# Patient Record
Sex: Female | Born: 1960 | Race: White | Hispanic: No | Smoking: Former smoker
Health system: Southern US, Community
[De-identification: ages and names within clinical notes are randomized; demographics above are authoritative.]

## PROBLEM LIST (undated history)

## (undated) DIAGNOSIS — J45909 Unspecified asthma, uncomplicated: Secondary | ICD-10-CM

## (undated) DIAGNOSIS — F329 Major depressive disorder, single episode, unspecified: Secondary | ICD-10-CM

## (undated) DIAGNOSIS — F32A Depression, unspecified: Secondary | ICD-10-CM

## (undated) DIAGNOSIS — F0781 Postconcussional syndrome: Secondary | ICD-10-CM

## (undated) DIAGNOSIS — M6289 Other specified disorders of muscle: Secondary | ICD-10-CM

## (undated) DIAGNOSIS — I639 Cerebral infarction, unspecified: Secondary | ICD-10-CM

## (undated) DIAGNOSIS — G8929 Other chronic pain: Secondary | ICD-10-CM

## (undated) DIAGNOSIS — S92309A Fracture of unspecified metatarsal bone(s), unspecified foot, initial encounter for closed fracture: Secondary | ICD-10-CM

## (undated) DIAGNOSIS — M25552 Pain in left hip: Secondary | ICD-10-CM

## (undated) HISTORY — DX: Pain in left hip: M25.552

## (undated) HISTORY — DX: Fracture of unspecified metatarsal bone(s), unspecified foot, initial encounter for closed fracture: S92.309A

## (undated) HISTORY — PX: CLAVICLE SURGERY: SHX598

## (undated) HISTORY — DX: Other chronic pain: G89.29

## (undated) HISTORY — DX: Depression, unspecified: F32.A

## (undated) HISTORY — DX: Major depressive disorder, single episode, unspecified: F32.9

## (undated) HISTORY — DX: Other specified disorders of muscle: M62.89

## (undated) HISTORY — DX: Postconcussional syndrome: F07.81

---

## 2003-06-03 ENCOUNTER — Encounter: Admission: RE | Admit: 2003-06-03 | Discharge: 2003-06-03 | Payer: Self-pay | Admitting: Internal Medicine

## 2008-12-23 ENCOUNTER — Encounter: Admission: RE | Admit: 2008-12-23 | Discharge: 2008-12-23 | Payer: Self-pay | Admitting: Internal Medicine

## 2010-01-12 ENCOUNTER — Ambulatory Visit: Payer: Self-pay | Admitting: Sports Medicine

## 2010-01-12 DIAGNOSIS — S92309A Fracture of unspecified metatarsal bone(s), unspecified foot, initial encounter for closed fracture: Secondary | ICD-10-CM | POA: Insufficient documentation

## 2010-02-02 ENCOUNTER — Ambulatory Visit: Payer: Self-pay | Admitting: Sports Medicine

## 2010-02-23 ENCOUNTER — Ambulatory Visit: Payer: Self-pay | Admitting: Sports Medicine
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
## 2010-07-19 NOTE — Assessment & Plan Note (Signed)
Summary: NP,FOOT INJURY DUE TO RUNNING,MC   Vital Signs:  Patient profile:   50 year old female Height:      67 inches Weight:      128.13 pounds BMI:     20.14 Pulse rate:   80 / minute BP sitting:   113 / 79  (left arm)  Vitals Entered By: Terese Door (January 12, 2010 1:56 PM) CC: NP-left foot injury-runner   CC:  NP-left foot injury-runner.  History of Present Illness: 50 yo F intense runner her for acute L foot pain.  Runs 20 miles per week and bikes 100 miles per week.  Was running 3 weeks ago with no recent change in mileage when at beginning of a run, had sudden onset of significant L foot pain on dorsum of her foot that stopped her in her run because she felt like "it broke."  No running for last 3 weeks. Had done barefoot run just prior to this injury. Had ankle injury 3l months ago. States had similar injury to her R foot last year and was told it was stress fracture, took about 10 weeks to completely heal. Using Newton shoes to run, which are good to protect MT but simultaneously weakens them. Normal menses.  Past History:  Past Medical History: mild asthma  Past Surgical History: denies  Physical Exam  General:  Well-developed,well-nourished,in no acute distress; alert,appropriate and cooperative throughout examination   Foot/Ankle Exam  Foot Exam:    Left:    Swelling:  yes on distal dorsum over 2nd-4th MTs    Inspection/Palpation:  + ttp over 2nd-4th MTs dorsal aspect.  No ttp on plantar MT head regions. Mild pain with extreme plantar/dorsa flexion of 3rd digit.    R > L transverse arch breakdown with wider R foot.   L foot very skinny with heel width of 1.6 cm and forefoot width of 1.8 cm.  Limited US L foot - + complete fx of distal 3rd and 4th MT.  No sign of callus formation.   Impression & Recommendations:  Problem # 1:  CLOSED FRACTURE OF METATARSAL BONE (ICD-825.25) 3rd and 4th distal MT fractures. - placed in cast shoe with heel lift -  given arch strap for compression/support - see pt instructions - f/u 2 weeks to re [place] and assess for healing  Orders: Post-op Shoe ([id number]) Garment,belt,sleeve or other covering ,elastic or similar stretch (E4540) [place] LIMITED ([id number])  Time spent with patient 35 mins  Patient Instructions: 1)  Calcium 2000 mg daily 2)  Vitamin D 800 IU daily 3)  Wear cast shoe except when showering until f/u 4)  Icing twice daily 5)  Ok for cross training as long as no pain. 6)  Please schedule a follow-up appointment in 2 weeks.

## 2010-07-19 NOTE — Assessment & Plan Note (Signed)
Summary: F/U US FOOT,MC   Vital Signs:  Patient profile:   50 year old female BP sitting:   122 / 80  Vitals Entered By: Lillia Pauls CMA (February 02, 2010 11:33 AM)  History of Present Illness: 50 yo F f/u L 3rd and 4th MT fx that occurred just after barefoot running.  Initial injury occurred about 6 weeks ago, seen in clinic 3 weeks ago and placed in post op shoe, not doing any running for last 2-3 weeks.  Not much activity at all. Overall, thinks it is feeling better.  Swelling improved. Not using any anti inflammatories or pain meds. Endorses taking Ca/Vit D.  Physical Exam  General:  Well-developed,well-nourished,in no acute distress; alert,appropriate and cooperative throughout examination   Foot/Ankle Exam  Foot Exam:    Left:    Inspection/Palpation:  Mild TTP over distal 3rd and 4th MT, but improved from last exam. Mild swelling in this area. NVI.    Limited US L foot - Distal 3rd and 4th MT show good callus formation and decreased blood flow.   Impression & Recommendations:  Problem # 1:  CLOSED FRACTURE OF METATARSAL BONE (ICD-825.25) Assessment Improved  Pain improved and healing well based on [place]. - Continue arch strap for compression/splinting - No longer needs post op shoe - Ok for regular running/tennis shoes - Can do light walking on treadmill or stationary bike as long as no pain - Start LE strength exercises as long as pain free in foot (toe raises, heel raises, half squats, half lunges) - continue Ca/Vit D - F/u 3 weeks  Orders: [place] LIMITED ([id number])

## 2010-10-26 DIAGNOSIS — Z01419 Encounter for gynecological examination (general) (routine) without abnormal findings: Secondary | ICD-10-CM | POA: Insufficient documentation

## 2010-11-29 ENCOUNTER — Encounter: Payer: Self-pay | Admitting: Family Medicine

## 2010-11-29 ENCOUNTER — Ambulatory Visit (INDEPENDENT_AMBULATORY_CARE_PROVIDER_SITE_OTHER): Payer: BC Managed Care – PPO | Admitting: Family Medicine

## 2010-11-29 VITALS — BP 119/80 | HR 76 | Ht 67.0 in | Wt 130.0 lb

## 2010-11-29 DIAGNOSIS — M79609 Pain in unspecified limb: Secondary | ICD-10-CM

## 2010-11-29 DIAGNOSIS — M79673 Pain in unspecified foot: Secondary | ICD-10-CM
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
## 2011-02-14 ENCOUNTER — Ambulatory Visit (INDEPENDENT_AMBULATORY_CARE_PROVIDER_SITE_OTHER): Payer: BC Managed Care – PPO | Admitting: Sports Medicine

## 2011-02-14 ENCOUNTER — Encounter: Payer: Self-pay | Admitting: Sports Medicine

## 2011-02-14 VITALS — BP 126/78 | HR 85 | Ht 67.0 in | Wt 133.0 lb

## 2011-02-14 DIAGNOSIS — M6289 Other specified disorders of muscle: Secondary | ICD-10-CM

## 2011-02-14 DIAGNOSIS — F0781 Postconcussional syndrome: Secondary | ICD-10-CM

## 2011-02-14 DIAGNOSIS — R29898 Other symptoms and signs involving the musculoskeletal system: Secondary | ICD-10-CM
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
## 2011-02-14 NOTE — Progress Notes (Deleted)
  Subjective:    Patient ID: Jacqueline Shaw, female    DOB: [birth year], 50 y.o.   MRN: [id number]  HPI    Review of Systems     Objective:   Physical Exam        Assessment & Plan:

## 2011-02-15 NOTE — Assessment & Plan Note (Signed)
We will review notes from other MD workups

## 2011-02-15 NOTE — Assessment & Plan Note (Signed)
Follow up 1 month

## 2011-03-16 ENCOUNTER — Ambulatory Visit: Payer: BC Managed Care – PPO | Admitting: Sports Medicine

## 2011-04-24 ENCOUNTER — Ambulatory Visit: Payer: BC Managed Care – PPO | Admitting: Sports Medicine

## 2012-05-30 ENCOUNTER — Telehealth: Payer: Self-pay | Admitting: *Deleted

## 2012-05-30 ENCOUNTER — Ambulatory Visit (INDEPENDENT_AMBULATORY_CARE_PROVIDER_SITE_OTHER): Payer: BC Managed Care – PPO | Admitting: Sports Medicine

## 2012-05-30 ENCOUNTER — Encounter: Payer: Self-pay | Admitting: Sports Medicine

## 2012-05-30 VITALS — BP 132/81 | HR 89 | Ht 67.0 in | Wt 132.0 lb

## 2012-05-30 DIAGNOSIS — M6289 Other specified disorders of muscle: Secondary | ICD-10-CM

## 2012-05-30 DIAGNOSIS — G8929 Other chronic pain: Secondary | ICD-10-CM | POA: Insufficient documentation

## 2012-05-30 DIAGNOSIS — M76899 Other specified enthesopathies of unspecified lower limb, excluding foot: Secondary | ICD-10-CM

## 2012-05-30 DIAGNOSIS — R29898 Other symptoms and signs involving the musculoskeletal system: Secondary | ICD-10-CM

## 2012-05-30 DIAGNOSIS — M7062 Trochanteric bursitis, left hip: Secondary | ICD-10-CM

## 2012-05-30 LAB — CBC
HCT: 40.3 % (ref 36.0–46.0)
Hemoglobin: 13.4 g/dL (ref 12.0–15.0)
MCH: 31.6 pg (ref 26.0–34.0)
MCHC: 33.3 g/dL (ref 30.0–36.0)
MCV: 95 fL (ref 78.0–100.0)
Platelets: 293 10*3/uL (ref 150–400)
RBC: 4.24 MIL/uL (ref 3.87–5.11)
RDW: 13 % (ref 11.5–15.5)
WBC: 4.4 10*3/uL (ref 4.0–10.5)

## 2012-05-30 LAB — FERRITIN: Ferritin: 23 ng/mL (ref 10–291)
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
## 2012-05-30 NOTE — Telephone Encounter (Signed)
Spoke with pt she states her hip pain is much worse now since having injection.  Painful to bear weight.  Also face is flushed. Per Dr. Darrick Penna advised pt she is probably having a steroid flare.  She should ice hip frequently and rest it for the next 3 days.  Ok to take Aleve or ibuprofen for pain.  Asked her to call back if she has any signs sx of infection.  Pt agreeable.

## 2012-05-30 NOTE — Assessment & Plan Note (Signed)
Not sure if over training nutrition iron deficiency her cardiovascular.  Likely exercise-related iron deficiency. No red flag signs or symptoms currently Plan: Discussed options Plan for CBC, ferritin, vitamin D levels today If iron low may require workup for colon cancer screening.  If all normal would consider cardiovascular as the next avenue of workup

## 2012-05-30 NOTE — Assessment & Plan Note (Signed)
Greater trochanteric bursitis associated with hip abductor weakness. Plan: Hip abductor strengthening.. IT band stretch Corticosteroid injection Followup in 6 weeks

## 2012-05-30 NOTE — Patient Instructions (Addendum)
Thank you for coming in today. Hip abductor weakness Exercises: 30 reps 2 sets Side leg raises Hip rotation Front step ups Side step ups Cross over step ups  Stretches: Prirformis IT band  I will call you with the lab results Come back in 6 weeks.   Call or go to the ER if you develop a large red swollen joint with extreme pain or oozing puss.

## 2012-05-31 ENCOUNTER — Telehealth: Payer: Self-pay | Admitting: Family Medicine

## 2012-05-31 LAB — VITAMIN D 25 HYDROXY (VIT D DEFICIENCY, FRACTURES): Vit D, 25-Hydroxy: 55 ng/mL (ref 30–89)

## 2012-05-31 MED ORDER — HYDROCODONE-ACETAMINOPHEN 5-325 MG PO TABS: 1.0000 | ORAL_TABLET | Freq: Four times a day (QID) | ORAL | Status: DC | PRN

## 2012-05-31 NOTE — Telephone Encounter (Signed)
Patient called back yesterday noting pain following the injection. The pain started a few hours after the injection. She notes that it has worsened since yesterday.  She notes difficulty sleeping because of pain. She denies any fevers or chills, or radiating pain. She lives in [place] and cannot stop by today for a recheck.   A: Injection steroid flair P: Offered a recheck today. Pt is unable to come. Called in Hydrocodone 5/325 1-2 tabs po q6h prn pain #20 Discussed warning signs and symptoms. Will call back today if not better.  F/u with urgent care PRN.

## 2012-06-03 ENCOUNTER — Telehealth: Payer: Self-pay | Admitting: *Deleted

## 2012-06-03 NOTE — Telephone Encounter (Signed)
Per Dr. Darrick Penna left her a VM to call back in regards to her labs.  Her ferritin was 23 and Dr. Darrick Penna suggests that she start on ferrous sulfate 325 per day.

## 2012-06-07 NOTE — Telephone Encounter (Signed)
Spoke with pt- advised her of ferritin level asked her to start ferrous sulfate 325 mg per day per Dr. Darrick Penna.   States steroid flare from hip inj has improved. Still having some of the original hip pain though, but has started strengthening exercises.  She will follow up with Dr. Darrick Penna in 5 weeks.

## 2012-06-07 NOTE — Telephone Encounter (Signed)
Left pt a VM

## 2012-07-09 ENCOUNTER — Ambulatory Visit: Payer: BC Managed Care – PPO | Admitting: Sports Medicine

## 2012-07-16 ENCOUNTER — Ambulatory Visit: Payer: BC Managed Care – PPO | Admitting: Sports Medicine

## 2012-08-06 ENCOUNTER — Ambulatory Visit (INDEPENDENT_AMBULATORY_CARE_PROVIDER_SITE_OTHER): Payer: BC Managed Care – PPO | Admitting: Sports Medicine

## 2012-08-06 ENCOUNTER — Encounter: Payer: Self-pay | Admitting: Sports Medicine

## 2012-08-06 VITALS — BP 124/80 | HR 92 | Ht 67.0 in | Wt 132.0 lb

## 2012-08-06 DIAGNOSIS — M25559 Pain in unspecified hip: Secondary | ICD-10-CM

## 2012-08-06 DIAGNOSIS — G8929 Other chronic pain: Secondary | ICD-10-CM

## 2012-08-06 DIAGNOSIS — M6289 Other specified disorders of muscle: Secondary | ICD-10-CM

## 2012-08-06 DIAGNOSIS — M25552 Pain in left hip: Secondary | ICD-10-CM

## 2012-08-06 DIAGNOSIS — R29898 Other symptoms and signs involving the musculoskeletal system: Secondary | ICD-10-CM

## 2012-08-06 NOTE — Patient Instructions (Signed)
It does not look like your hip problems are from arthritis-- it is more likely a biomechanical issue.  Start the physical therapy.  We gave you the prescription.  Come back to see Dr. Darrick Penna in 4-6 weeks, after you have done most of your PT.  Ask your PCP to send any records (mostly labs) that have been done in the past 1-2 years for Dr. Darrick Penna to review.

## 2012-08-06 NOTE — Assessment & Plan Note (Signed)
Reck with primary physician about any medical issues  I may need to do some additional workup if not clear cause
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
## 2012-08-06 NOTE — Assessment & Plan Note (Signed)
I think she is shifting her pelvic position and this is leading to increased stress over gluteus medius, piriformis and hamstrings on left  Gait is not equal side to side  I would like her to see Sharen Hones for a full assessment and see if he can help her establish more normal biomechanics and work on rehab of hip area

## 2012-09-10 ENCOUNTER — Ambulatory Visit: Payer: BC Managed Care – PPO | Admitting: Sports Medicine

## 2012-10-15 ENCOUNTER — Ambulatory Visit (INDEPENDENT_AMBULATORY_CARE_PROVIDER_SITE_OTHER): Payer: BC Managed Care – PPO | Admitting: Sports Medicine

## 2012-10-15 VITALS — BP 120/70 | Ht 67.0 in | Wt 132.0 lb

## 2012-10-15 DIAGNOSIS — M25559 Pain in unspecified hip: Secondary | ICD-10-CM

## 2012-10-15 DIAGNOSIS — F3289 Other specified depressive episodes: Secondary | ICD-10-CM

## 2012-10-15 DIAGNOSIS — G8929 Other chronic pain: Secondary | ICD-10-CM

## 2012-10-15 DIAGNOSIS — F0781 Postconcussional syndrome: Secondary | ICD-10-CM

## 2012-10-15 DIAGNOSIS — M25552 Pain in left hip: Secondary | ICD-10-CM

## 2012-10-15 DIAGNOSIS — F329 Major depressive disorder, single episode, unspecified: Secondary | ICD-10-CM

## 2012-10-15 MED ORDER — AMITRIPTYLINE HCL 25 MG PO TABS: 25.0000 mg | ORAL_TABLET | Freq: Every day | ORAL | Status: DC

## 2012-10-15 NOTE — Assessment & Plan Note (Signed)
I suggested relative rest for this condition until we can resolve the other more significant problems
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
## 2012-10-15 NOTE — Patient Instructions (Addendum)
Keep up fish oil  Try to add some glutathione to your supplements  Let's try using amitriptyline 25 at night   Stop training and go to easy activity for 30 minutes a day/ if you do swimming cut the volume and effort  Keep up easy hip exercises  Try to deal with personal stresses and start getting some relaxation each day  See me in one month but track your sxs carefully
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
## 2012-10-15 NOTE — Assessment & Plan Note (Signed)
As suggested by her primary care physician I think she has depression. Low-dose amitriptyline may help if it restores the sleep cycle.  However, I would like her to return to DR Riley Nearing for more aggressive treatment of depression if needed.

## 2012-11-05 ENCOUNTER — Ambulatory Visit (INDEPENDENT_AMBULATORY_CARE_PROVIDER_SITE_OTHER): Payer: BC Managed Care – PPO | Admitting: Sports Medicine

## 2012-11-05 VITALS — BP 100/60 | Ht 67.0 in | Wt 132.0 lb

## 2012-11-05 DIAGNOSIS — M25559 Pain in unspecified hip: Secondary | ICD-10-CM

## 2012-11-05 DIAGNOSIS — F0781 Postconcussional syndrome: Secondary | ICD-10-CM

## 2012-11-05 DIAGNOSIS — G8929 Other chronic pain: Secondary | ICD-10-CM

## 2012-11-05 DIAGNOSIS — F329 Major depressive disorder, single episode, unspecified: Secondary | ICD-10-CM

## 2012-11-05 MED ORDER — CYCLOBENZAPRINE HCL 5 MG PO TABS: 5.0000 mg | ORAL_TABLET | Freq: Every day | ORAL | Status: DC
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
## 2012-11-05 NOTE — Assessment & Plan Note (Signed)
Improved sleep pattern may help  Cont to discuss this with Dr Luan Pulling by me 6 wks

## 2012-11-05 NOTE — Assessment & Plan Note (Signed)
This still appears muscular to me  Try deep massage - to see joel tull  Keep working mm strength  Don't worry about training until she deals with personal stressors

## 2012-11-05 NOTE — Assessment & Plan Note (Signed)
She looks better after using amitriptyline However because of side effects we will change to milder dose of flexeril 5 mgm at HS I think she should use this for next 3 months or so  Work on balance

## 2012-11-14 ENCOUNTER — Ambulatory Visit: Payer: BC Managed Care – PPO | Admitting: Sports Medicine

## 2012-12-17 ENCOUNTER — Ambulatory Visit: Payer: BC Managed Care – PPO | Admitting: Sports Medicine

## 2012-12-18 ENCOUNTER — Ambulatory Visit (INDEPENDENT_AMBULATORY_CARE_PROVIDER_SITE_OTHER): Payer: BC Managed Care – PPO | Admitting: Sports Medicine

## 2012-12-18 VITALS — BP 126/81 | Ht 67.0 in | Wt 133.0 lb

## 2012-12-18 DIAGNOSIS — R29898 Other symptoms and signs involving the musculoskeletal system: Secondary | ICD-10-CM

## 2012-12-18 DIAGNOSIS — F329 Major depressive disorder, single episode, unspecified: Secondary | ICD-10-CM

## 2012-12-18 DIAGNOSIS — M6289 Other specified disorders of muscle: Secondary | ICD-10-CM

## 2012-12-18 DIAGNOSIS — F32A Depression, unspecified: Secondary | ICD-10-CM

## 2012-12-18 DIAGNOSIS — F0781 Postconcussional syndrome: Secondary | ICD-10-CM
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
## 2012-12-18 NOTE — Progress Notes (Signed)
Patient ID: Jacqueline Shaw, female   DOB: 10-Oct-1960, 52 y.o.   MRN: [id number]  Still with major stress Sleeps better w flexeril Wakes up 1 to 2x per night  Most of muscle sxs are lower extremity  Has rt AT tightness Feels weak in her hamstrings and hips She gets some bilateral hip pain over the greater trochanters  Pain but she gets fatigued after 50 minutes or so Running causes fatigue very quickly Swimming does not cause much fatigue but she feels like she's been most to work with her upper body  She admits that she is probably depressed primarily over the marital discord. She has not been successful with trying multiple antidepressant medications in the past   Physical exam:  Patient is pleasant but is almost tearful at times  Lt ankle: Normal AT and peroneal tendons No visible erythema or swelling. Range of motion is full in all directions. Strength is 5/5 in all directions. Stable lateral and medial ligaments; squeeze test and kleiger test unremarkable; Talar dome nontender; No pain at base of 5th MT; No tenderness over cuboid; No tenderness over N spot or navicular prominence No tenderness on posterior aspects of lateral and medial malleolus No sign of peroneal tendon subluxations; Negative tarsal tunnel tinel's Able to walk 4 steps.   Good quad strength bilat Lt hip pain over greater troch with hip rotation Rt hip- pain in piriformis with hip rotation Hip flexion good bilat Hip motion good bilat Good abduction strength bilat Good tensor fascilata strength bilat Weak glut maximus on lt, slightly weak on rt ttp over both HS at ischial tub HS weakness bilat Weak reverse leg raise bilat  Bilat shoulder exam: Empty can neg Biceps testing good   Poor balance on lt leg with cone touch Increased sway - standing with eyes closed on rt, unable to balance on lt foot with eyes closed

## 2012-12-18 NOTE — Assessment & Plan Note (Signed)
I am not sure why she has developed this but she has significant hamstring and gluteus maximus weakness today  My suspicion is that this had something to do with change in her gait after returning from concussion  I think the stress and poor rest pattern over the last year have kept her from recovering and developing normal strength in these muscle groups

## 2012-12-18 NOTE — Patient Instructions (Addendum)
Continue massage  Continue flexeril at bedtime  Try to get 30 minutes of aerobic exercise daily- you can do more, but do not push to fatigue  Please do the following exercises daily: Standing running lunge- holding light dumbbells  Hamstring curls and swings wearing ankle weights Standing and lying straight leg raises to the back wearing ankle weights as tolerated Hamstring extender- no weight at first, ok to use ankle weights as you get stronger  Cone touch  One foot balance with eyes closed  Please follow up in 6 weeks  Thank you for seeing [place] today!

## 2012-12-18 NOTE — Assessment & Plan Note (Signed)
I suggested again that if the depression remained significant return to Dr. Riley Nearing and consider trying a different antidepressant. She did OK on Wellbutrin  She and to push her training too hard until she gets through the personal and psychological stressors  Recheck with me in a couple of months

## 2012-12-31 ENCOUNTER — Other Ambulatory Visit: Payer: Self-pay | Admitting: *Deleted

## 2012-12-31 MED ORDER — CYCLOBENZAPRINE HCL 5 MG PO TABS: 5.0000 mg | ORAL_TABLET | Freq: Every day | ORAL | Status: DC

## 2013-01-28 ENCOUNTER — Ambulatory Visit: Payer: BC Managed Care – PPO | Admitting: Sports Medicine

## 2013-02-26 ENCOUNTER — Encounter: Payer: Self-pay | Admitting: Sports Medicine

## 2013-02-26 ENCOUNTER — Ambulatory Visit (INDEPENDENT_AMBULATORY_CARE_PROVIDER_SITE_OTHER): Payer: BC Managed Care – PPO | Admitting: Sports Medicine

## 2013-02-26 VITALS — BP 125/79 | HR 67 | Ht 67.0 in | Wt 133.0 lb

## 2013-02-26 DIAGNOSIS — R29898 Other symptoms and signs involving the musculoskeletal system: Secondary | ICD-10-CM

## 2013-02-26 DIAGNOSIS — M6289 Other specified disorders of muscle: Secondary | ICD-10-CM

## 2013-02-26 DIAGNOSIS — Z72820 Sleep deprivation: Secondary | ICD-10-CM

## 2013-02-26 DIAGNOSIS — F0781 Postconcussional syndrome: Secondary | ICD-10-CM

## 2013-02-26 NOTE — Patient Instructions (Signed)
Thank you for coming in today  Stop flexeril Restart amitriptyline 1/2 tablet at night No liquids 3 hrs before bedtime Continue counseling. Please call if you need another name to try. We will refer you to vestibular rehab/neuro rehab  Followup in 1 month
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
## 2013-09-27 ENCOUNTER — Other Ambulatory Visit: Payer: Self-pay

## 2013-09-27 ENCOUNTER — Encounter (HOSPITAL_COMMUNITY): Payer: Self-pay | Admitting: Emergency Medicine

## 2013-09-27 ENCOUNTER — Emergency Department (HOSPITAL_COMMUNITY): Payer: BC Managed Care – PPO

## 2013-09-27 ENCOUNTER — Emergency Department (HOSPITAL_COMMUNITY)
Admission: EM | Admit: 2013-09-27 | Discharge: 2013-09-27 | Disposition: A | Discharge status: Home / Self Care | Payer: BC Managed Care – PPO | Attending: Emergency Medicine | Admitting: Emergency Medicine

## 2013-09-27 DIAGNOSIS — J45909 Unspecified asthma, uncomplicated: Secondary | ICD-10-CM | POA: Insufficient documentation

## 2013-09-27 DIAGNOSIS — R42 Dizziness and giddiness: Secondary | ICD-10-CM | POA: Insufficient documentation

## 2013-09-27 DIAGNOSIS — Z8673 Personal history of transient ischemic attack (TIA), and cerebral infarction without residual deficits: Secondary | ICD-10-CM | POA: Insufficient documentation

## 2013-09-27 DIAGNOSIS — Z87891 Personal history of nicotine dependence: Secondary | ICD-10-CM | POA: Insufficient documentation

## 2013-09-27 DIAGNOSIS — Z79899 Other long term (current) drug therapy: Secondary | ICD-10-CM | POA: Insufficient documentation

## 2013-09-27 DIAGNOSIS — R002 Palpitations: Secondary | ICD-10-CM | POA: Insufficient documentation

## 2013-09-27 DIAGNOSIS — Z7982 Long term (current) use of aspirin: Secondary | ICD-10-CM | POA: Insufficient documentation

## 2013-09-27 HISTORY — DX: Unspecified asthma, uncomplicated: J45.909

## 2013-09-27 HISTORY — DX: Cerebral infarction, unspecified: I63.9

## 2013-09-27 LAB — BASIC METABOLIC PANEL
BUN: 15 mg/dL (ref 6–23)
CO2: 24 mEq/L (ref 19–32)
Calcium: 9.9 mg/dL (ref 8.4–10.5)
Chloride: 103 mEq/L (ref 96–112)
Creatinine, Ser: 0.78 mg/dL (ref 0.50–1.10)
GFR calc Af Amer: >90 mL/min (ref >90)
GFR calc non Af Amer: >90 mL/min (ref >90)
Glucose, Bld: 99 mg/dL (ref 70–99)
Potassium: 4.2 mEq/L (ref 3.7–5.3)
Sodium: 141 mEq/L (ref 137–147)

## 2013-09-27 LAB — CBC
HCT: 39.5 % (ref 36.0–46.0)
Hemoglobin: 13.9 g/dL (ref 12.0–15.0)
MCH: 33.2 pg (ref 26.0–34.0)
MCHC: 35.2 g/dL (ref 30.0–36.0)
MCV: 94.3 fL (ref 78.0–100.0)
Platelets: 262 10*3/uL (ref 150–400)
RBC: 4.19 MIL/uL (ref 3.87–5.11)
RDW: 12.8 % (ref 11.5–15.5)
WBC: 8.7 10*3/uL (ref 4.0–10.5)

## 2013-09-27 LAB — I-STAT TROPONIN, ED: Troponin i, poc: 0 ng/mL (ref 0.00–0.08)

## 2013-09-27 NOTE — ED Notes (Signed)
Unable to get into the room with the pt edp at bedside now

## 2013-09-27 NOTE — ED Provider Notes (Signed)
CSN: [id number]     Arrival date & time 09/27/13  1832 History   First MD Initiated Contact with Patient 09/27/13 2033     Chief Complaint  Patient presents with  . Tachycardia      HPI Pt reports while running that her heart rate went to 210. Pt reports heart rate would decrease when she would come to a complete stop. Pt reports similar incident happened back in March. Pt reports normally during her run her heart rate would go no higher than 170. Pt reports feeling dizziness for some time, around 2012. Pt reports intermittent chest pain during her runs. Pt also reports fatigue prior to room.  Past Medical History  Diagnosis Date  . Stroke   . Asthma    History reviewed. No pertinent past surgical history. No family history on file. History  Substance Use Topics  . Smoking status: Former Games developer  . Smokeless tobacco: Never Used  . Alcohol Use: Yes     Comment: ocassional   OB History   Grav Para Term Preterm Abortions TAB SAB Ect Mult Living                 Review of Systems    Allergies  Bee venom and Shellfish allergy  Home Medications   Current Outpatient Rx  Name  Route  Sig  Dispense  Refill  . albuterol (PROVENTIL HFA;VENTOLIN HFA) 108 (90 BASE) MCG/ACT inhaler   Inhalation   Inhale 1 puff into the lungs every 6 (six) hours as needed for wheezing or shortness of breath.         . amphetamine-dextroamphetamine (ADDERALL) 10 MG tablet   Oral   Take 10 mg by mouth daily with breakfast.         . aspirin EC 81 MG tablet   Oral   Take 81 mg by mouth daily.         . Cholecalciferol (VITAMIN D3) 2400 UNIT/ML LIQD   Oral   Take 1 mL by mouth once a week.          . co-enzyme Q-10 30 MG capsule   Oral   Take 30 mg by mouth daily.           . Flaxseed, Linseed, (FLAX SEED OIL PO)   Oral   Take 3-4 capsules by mouth daily.         . Multiple Vitamin (MULTIVITAMIN) tablet   Oral   Take 1 tablet by mouth daily.           . Omega-3 Fatty  Acids (FISH OIL PO)   Oral   Take 3-4 capsules by mouth daily.          BP 128/65  Pulse 76  Temp(Src) 98 F (36.7 C) (Oral)  Resp 16  Ht 5\' 7"  (1.702 m)  Wt 141 lb (63.957 kg)  BMI 22.08 kg/m2  SpO2 100%  LMP 08/30/2013 Physical Exam  Nursing note and vitals reviewed. Constitutional: She is oriented to person, place, and time. She appears well-developed and well-nourished. No distress.  HENT:  Head: Normocephalic and atraumatic.  Eyes: Pupils are equal, round, and reactive to light.  Neck: Normal range of motion.  Cardiovascular: Normal rate and intact distal pulses.  Exam reveals no friction rub.   No murmur heard. Pulmonary/Chest: No respiratory distress.  Abdominal: Normal appearance. She exhibits no distension.  Musculoskeletal: Normal range of motion.  Neurological: She is alert and oriented to person, place, and time. No cranial  nerve deficit.  Skin: Skin is warm and dry. No rash noted.  Psychiatric: She has a normal mood and affect. Her behavior is normal.    ED Course  Procedures (including critical care time) Labs Review Labs Reviewed  CBC  BASIC METABOLIC PANEL  I-STAT TROPOININ, ED   Imaging Review Dg Chest 2 View (if Patient Has Fever And/or Copd)  09/27/2013   CLINICAL DATA:  Tachycardia  EXAM: CHEST  2 VIEW  COMPARISON:  None.  FINDINGS: The heart size and mediastinal contours are within normal limits. Both lungs are clear. The visualized skeletal structures are unremarkable.  IMPRESSION: No active cardiopulmonary disease.   Electronically Signed   By: Signa Kellaylor  Stroud M.D.   On: 09/27/2013 19:50      Date: 09/27/2013  Rate: 58  Rhythm: normal sinus rhythm  QRS Axis: normal  Intervals: normal  ST/T Wave abnormalities: normal  Conduction Disutrbances:none:   Old EKG Reviewed: none available      MDM   Final diagnoses:  Palpitations    58    Nelia Shiobert L Davonta Stroot, MD 09/28/13 2329

## 2013-09-27 NOTE — ED Notes (Addendum)
Pt reports while running that her heart rate went to 210. Pt reports heart rate would decrease when she would come to a complete stop. Pt reports similar incident happened back in March. Pt reports normally during her run her heart rate would go no higher than 170. Pt reports feeling dizziness for some time, around 2012. Pt reports intermittent chest pain during her runs. Pt also reports fatigue prior to room.

## 2013-10-02 ENCOUNTER — Telehealth: Payer: Self-pay | Admitting: Cardiovascular Disease

## 2013-10-02 NOTE — Telephone Encounter (Signed)
Informed patient that Dr. Excell Seltzer and his primary nurse are not in office today.  If Dr. Excell Seltzer can see her as a new patient, his nurse would be the one to schedule this.  Otherwise his schedule is full until July. Informed her someone will call her back tomorrow or early next week to discuss a possible new patient appointment.  Patient verbalizes understanding.

## 2013-10-02 NOTE — Telephone Encounter (Signed)
lmtcb

## 2013-10-02 NOTE — Telephone Encounter (Signed)
New Message:  Pt is requesting a call back from the nurse to be worked in as a new pt... Pt was recently seen in the hospital Dx Palpitations. Discharge instructions states Call Tonny BollmanMichael Cooper, MD.  Pt's PcP Dr Riley NearingAguiar in [place]. Pt did not have number. ... Pt wants a call back with a new pt appt with Dr. Excell Seltzerooper. He has no more available slots

## 2013-10-03 NOTE — Telephone Encounter (Signed)
Please schedule this pt to see Cardiologist with first available new patient slot.

## 2013-10-07 NOTE — Telephone Encounter (Signed)
Appointment has been scheduled on 10/09/13 with Dr Eden Emms.

## 2013-10-08 ENCOUNTER — Encounter: Payer: Self-pay | Admitting: *Deleted

## 2013-10-08 ENCOUNTER — Encounter: Payer: Self-pay | Admitting: Cardiovascular Disease

## 2013-10-09 ENCOUNTER — Ambulatory Visit (INDEPENDENT_AMBULATORY_CARE_PROVIDER_SITE_OTHER): Payer: BC Managed Care – PPO | Admitting: Cardiovascular Disease

## 2013-10-09 ENCOUNTER — Encounter: Payer: Self-pay | Admitting: Cardiovascular Disease

## 2013-10-09 VITALS — BP 140/90 | HR 86 | Ht 67.0 in | Wt 144.0 lb

## 2013-10-09 DIAGNOSIS — F3289 Other specified depressive episodes: Secondary | ICD-10-CM

## 2013-10-09 DIAGNOSIS — F329 Major depressive disorder, single episode, unspecified: Secondary | ICD-10-CM

## 2013-10-09 DIAGNOSIS — R0600 Dyspnea, unspecified: Secondary | ICD-10-CM | POA: Insufficient documentation

## 2013-10-09 DIAGNOSIS — R0989 Other specified symptoms and signs involving the circulatory and respiratory systems: Secondary | ICD-10-CM

## 2013-10-09 DIAGNOSIS — R002 Palpitations: Secondary | ICD-10-CM

## 2013-10-09 DIAGNOSIS — R0609 Other forms of dyspnea: Secondary | ICD-10-CM

## 2013-10-09 DIAGNOSIS — F32A Depression, unspecified: Secondary | ICD-10-CM

## 2013-10-09 DIAGNOSIS — R000 Tachycardia, unspecified: Secondary | ICD-10-CM

## 2013-10-09 LAB — HEMOGLOBIN AND HEMATOCRIT, BLOOD
HEMATOCRIT: 40.3 % (ref 36.0–46.0)
Hemoglobin: 13.6 g/dL (ref 12.0–15.0)

## 2013-10-09 LAB — TSH: TSH: 1.75 u[IU]/mL (ref 0.35–5.50)

## 2013-10-09 LAB — RHEUMATOID FACTOR

## 2013-10-09 LAB — SEDIMENTATION RATE: Sed Rate: 9 mm/hr (ref 0–22)
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
## 2013-10-09 NOTE — Assessment & Plan Note (Signed)
May be playing a role here.  Hormone replacement also seems like an attempt to "feel"  Better Not clear why she needs testosterone other than performance

## 2013-10-09 NOTE — Assessment & Plan Note (Signed)
Normal exam and CXR  Order cardiopulmonary stress test to see if exercise limited by CO or pulmonary funcition  Will also help with objective HR response to exercise

## 2013-10-09 NOTE — Assessment & Plan Note (Signed)
Not clear that she is having SVT.  24 hour holter to see what average HR is and document normal circadian fall at night.  Echo to r/o structural heart disease.  Labs to include TSH today

## 2013-10-09 NOTE — Patient Instructions (Signed)
You will need lab work today: Hemoglobin & Hematocrit, T4 & TSH, ESR, Rheumatoid factor We will call you with your results  Your physician has recommended that you have a cardiopulmonary stress test (CPX). CPX testing is a non-invasive measurement of heart and lung function. It replaces a traditional treadmill stress test. This type of test provides a tremendous amount of information that relates not only to your present condition but also for future outcomes. This test combines measurements of you ventilation, respiratory gas exchange in the lungs, electrocardiogram (EKG), blood pressure and physical response before, during, and following an exercise protocol.  Your physician has recommended that you wear a 24 hour holter monitor. Holter monitors are medical devices that record the heart's electrical activity. Doctors most often use these monitors to diagnose arrhythmias. Arrhythmias are problems with the speed or rhythm of the heartbeat. The monitor is a small, portable device. You can wear one while you do your normal daily activities. This is usually used to diagnose what is causing palpitations/syncope (passing out).  Your physician has requested that you have an echocardiogram. Echocardiography is a painless test that uses sound waves to create images of your heart. It provides your doctor with information about the size and shape of your heart and how well your heart's chambers and valves are working. This procedure takes approximately one hour. There are no restrictions for this procedure.   

## 2013-10-10 LAB — T4: T4 TOTAL: 7.7 ug/dL (ref 5.0–12.5)

## 2013-10-13 ENCOUNTER — Ambulatory Visit (HOSPITAL_COMMUNITY): Payer: BC Managed Care – PPO | Attending: Cardiovascular Disease

## 2013-10-13 ENCOUNTER — Encounter: Payer: Self-pay | Admitting: *Deleted

## 2013-10-13 ENCOUNTER — Encounter (INDEPENDENT_AMBULATORY_CARE_PROVIDER_SITE_OTHER): Payer: BC Managed Care – PPO

## 2013-10-13 ENCOUNTER — Telehealth: Payer: Self-pay | Admitting: Cardiology

## 2013-10-13 DIAGNOSIS — R002 Palpitations: Secondary | ICD-10-CM

## 2013-10-13 DIAGNOSIS — R000 Tachycardia, unspecified: Secondary | ICD-10-CM

## 2013-10-13 NOTE — Telephone Encounter (Signed)
New message  ° ° °Patient calling back to speak with nurse  °

## 2013-10-13 NOTE — Telephone Encounter (Signed)
PT AWARE OF LAB RESULTS./CY 

## 2013-10-13 NOTE — Progress Notes (Unsigned)
Patient ID: Jacqueline Shaw, female   DOB: 29-Sep-1960, 53 y.o.   MRN: [id number] E-cardio 24 hour holter monitor applied to patient.

## 2013-10-14 DIAGNOSIS — R0989 Other specified symptoms and signs involving the circulatory and respiratory systems: Secondary | ICD-10-CM

## 2013-10-14 DIAGNOSIS — R0609 Other forms of dyspnea: Secondary | ICD-10-CM

## 2013-10-17 ENCOUNTER — Ambulatory Visit (HOSPITAL_COMMUNITY): Payer: BC Managed Care – PPO | Attending: Internal Medicine | Admitting: Radiology

## 2013-10-17 DIAGNOSIS — R000 Tachycardia, unspecified: Secondary | ICD-10-CM | POA: Insufficient documentation

## 2013-10-17 DIAGNOSIS — R0600 Dyspnea, unspecified: Secondary | ICD-10-CM

## 2013-10-17 DIAGNOSIS — R0602 Shortness of breath: Secondary | ICD-10-CM

## 2013-10-17 DIAGNOSIS — R002 Palpitations: Secondary | ICD-10-CM

## 2013-10-17 DIAGNOSIS — I519 Heart disease, unspecified: Secondary | ICD-10-CM | POA: Insufficient documentation

## 2013-10-17 NOTE — Progress Notes (Signed)
Echocardiogram performed.  

## 2013-10-27 ENCOUNTER — Telehealth: Payer: Self-pay | Admitting: *Deleted

## 2013-10-27 NOTE — Telephone Encounter (Signed)
LEFT MESSAGE TO CALL BACK RE  MONITOR PER  DR NISHAN  SR  PAC NO SIG ARRHYTHMIAS./CY

## 2013-10-27 NOTE — Telephone Encounter (Signed)
PT AWARE OF HOLTER RESULTS./CY 

## 2013-10-28 ENCOUNTER — Encounter: Payer: Self-pay | Admitting: Sports Medicine

## 2013-10-28 ENCOUNTER — Ambulatory Visit (INDEPENDENT_AMBULATORY_CARE_PROVIDER_SITE_OTHER): Payer: BC Managed Care – PPO | Admitting: Sports Medicine

## 2013-10-28 VITALS — BP 118/80 | Ht 67.0 in | Wt 142.0 lb

## 2013-10-28 DIAGNOSIS — N393 Stress incontinence (female) (male): Secondary | ICD-10-CM

## 2013-10-28 DIAGNOSIS — G8929 Other chronic pain: Secondary | ICD-10-CM

## 2013-10-28 DIAGNOSIS — M25552 Pain in left hip: Principal | ICD-10-CM

## 2013-10-28 DIAGNOSIS — R269 Unspecified abnormalities of gait and mobility: Secondary | ICD-10-CM | POA: Insufficient documentation

## 2013-10-28 DIAGNOSIS — M25559 Pain in unspecified hip: Secondary | ICD-10-CM

## 2013-10-28 NOTE — Patient Instructions (Signed)
Easy hamstring stretches - 3 stretches 20 sec each  Hamstring exercises - M/ w/ F Extender 5 sets of 8 if easy move to weights Diver 4 sets of 8 Reverse lunge on step - 3 x 15  HS exercises - Tu/ thurs/ Sat Hops - 3 sets of 10 hops each leg overstrides - 50 yards x 5 Basic curls and swings 3 sets of 15 curls and swings  Hip exercises Lateral lift  Standing rotations  Butt squeeze is 5 for 5 breaths \\Bridge  with butt contraction Straight leg raise with abdominal muscle firing ont that side
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
## 2013-10-28 NOTE — Assessment & Plan Note (Signed)
Dynamic gait drills prescribed  See the effect on fatigue

## 2013-10-28 NOTE — Assessment & Plan Note (Signed)
This still seems a biomechanical issue Very poor strength in medial HS and left hip abductors  See HEP  Reck 6 weeks

## 2013-10-28 NOTE — Assessment & Plan Note (Signed)
Given a series of pelvic stabilization exercises

## 2013-11-27 ENCOUNTER — Encounter: Payer: Self-pay | Admitting: Sports Medicine

## 2013-11-27 ENCOUNTER — Ambulatory Visit (INDEPENDENT_AMBULATORY_CARE_PROVIDER_SITE_OTHER): Payer: BC Managed Care – PPO | Admitting: Sports Medicine

## 2013-11-27 VITALS — BP 140/81 | Ht 67.0 in | Wt 140.0 lb

## 2013-11-27 DIAGNOSIS — S93609A Unspecified sprain of unspecified foot, initial encounter: Secondary | ICD-10-CM

## 2013-11-27 DIAGNOSIS — M25552 Pain in left hip: Principal | ICD-10-CM

## 2013-11-27 DIAGNOSIS — M25559 Pain in unspecified hip: Secondary | ICD-10-CM

## 2013-11-27 DIAGNOSIS — G8929 Other chronic pain: Secondary | ICD-10-CM

## 2013-11-27 DIAGNOSIS — S93692A Other sprain of left foot, initial encounter: Secondary | ICD-10-CM

## 2013-11-27 DIAGNOSIS — N393 Stress incontinence (female) (male): Secondary | ICD-10-CM

## 2013-11-27 DIAGNOSIS — M722 Plantar fascial fibromatosis: Secondary | ICD-10-CM

## 2013-11-27 NOTE — Assessment & Plan Note (Signed)
She was given exercises Arch strap Icing Heel cup  She will probably need to cross train for about 6 weeks until I can reevaluate this

## 2013-11-27 NOTE — Assessment & Plan Note (Signed)
Improved with exercises and she should continue these

## 2013-11-27 NOTE — Progress Notes (Signed)
History was provided by the patient.  Jacqueline Shaw is a 53 y.o. female who is here for chronic hip pain follow up.     HPI:    Hip Pain Ms. Jacqueline Shaw reports that she has been doing well since her last visit. She has been doing the strengthening exercises that were prescribed daily for four weeks and says that she has noticed an improvement. The exercises concentrate on strengthening her core, hamstrings and gluteus and she feels stronger in these areas. She also reports that she has started to notice that her hip pain is improving.   She still has difficulty with athletic activity compared to her prior baseline. She reports that whenever she does the exercises, runs, bikes or does other motor activity like get out of a car, it is like she is "doing it for the first time" and she has to "think about the movements." She says she can feel her body getting stronger but she is still having some trouble with athletic activity. She has had two good runs in the past month in which she felt like her normal self for the first time.   Foot Pain She reports that on Sunday she went on a run and felt a sharp pain in her left foot which felt similar to plantar fasciitis which she had 6 years ago. The following day, the pain was so severe she could not walk, but it has since improved. The pain is located in her arch and inner heel primarily, but extends to include the entire heel. It is worse after sitting and immobility and improves with movement.    Stress incontinence Reports that her incontinence is improving with exercises she is doing to strengthen pelvic floor muscles   Physical Exam:  BP 140/81  Ht 5\' 7"  (1.702 m)  Wt 140 lb (63.504 kg)  BMI 21.92 kg/m2    General:   alert, cooperative and no distress. Very fit athletic woman  Skin:   tanned. otherwise normal  MSK:  Hips: good range of motion with both internal and external rotation of bilateral hips. Strength is improved.   heel: pain with  palpation of left heel plantar surface, especially at insertion of plantar fascia to calcaneous. Able to walk without pain. On running, with significant foot pain requiring stopping. Subsequently with limp.  Neuro:  normal without focal findings and mental status, speech normal, alert and oriented x3   Ultrasound:  small tear at the insertion of the plantar fascia to the calcaneous. No significant increase in fascia thickness. Some associated swelling.   Assessment/Plan:  1. Chronic left hip pain Improving with strengthening exercises. Will add some additional exercises to continue to increase balance and strength  - continue exercises, adding increased weight to 5 pounds one day per week,  - Do balance tests with eyes closed - start calf raises- 3 sets of 15  2. Stress incontinence Improving - continue pelvic floor strengthening exercises  3. Traumatic rupture of plantar fascia of left foot ultrasound evaluation today reveals small tear at the insertion of the plantar fascia to the calcaneous. No significant increase in fascia thickness. Some associated swelling. Patient with significant pain on running. Will rest and ice.  - No hopping exercises with hurt foot - Over the next 6 weeks, avoid running, focus on biking  - Ice the affected heel    - Follow-up visit in 6 weeks for recheck, or sooner as needed.   Jacqueline Pingley [name], MD [facility] Resident, PGY1 11/27/2013  Edited and reviewed Sterling Big, MD

## 2013-11-27 NOTE — Assessment & Plan Note (Signed)
Continue to work on hip strip and better function of her hamstrings and gluteus maximus

## 2013-11-27 NOTE — Patient Instructions (Signed)
Easy hamstring stretches - 3 stretches 20 sec each   Hamstring exercises - M/ w/ F  Extender 5 sets of 8 if easy move to weights  Diver 4 sets of 8  Reverse lunge on step - 3 x 15   HS exercises - Tu/ thurs/ Sat  Hops - 3 sets of 10 hops each leg  overstrides - 50 yards x 5  Basic curls and swings 3 sets of 15 curls and swings   Hip exercises  Lateral lift  Standing rotations   Butt squeeze is 5 for 5 breaths  Bridge with butt contraction  Straight leg raise with abdominal muscle firing on that side    New additions:  Do balance tests with eyes closed No hopping with hurt foot Calf raises on step- 3 sets of 15 One day per week, increase weight to 5 pounds Over the next 6 weeks, focus on biking  Ice the affected heel a lot

## 2013-12-08 DIAGNOSIS — R059 Cough, unspecified: Secondary | ICD-10-CM | POA: Insufficient documentation

## 2013-12-08 DIAGNOSIS — J45990 Exercise induced bronchospasm: Secondary | ICD-10-CM | POA: Insufficient documentation

## 2013-12-08 DIAGNOSIS — F4322 Adjustment disorder with anxiety: Secondary | ICD-10-CM | POA: Insufficient documentation

## 2013-12-08 DIAGNOSIS — R55 Syncope and collapse: Secondary | ICD-10-CM | POA: Insufficient documentation

## 2013-12-08 DIAGNOSIS — N951 Menopausal and female climacteric states: Secondary | ICD-10-CM | POA: Insufficient documentation

## 2013-12-08 DIAGNOSIS — J301 Allergic rhinitis due to pollen: Secondary | ICD-10-CM | POA: Insufficient documentation

## 2013-12-08 DIAGNOSIS — G4700 Insomnia, unspecified: Secondary | ICD-10-CM | POA: Insufficient documentation

## 2013-12-08 DIAGNOSIS — F4310 Post-traumatic stress disorder, unspecified: Secondary | ICD-10-CM | POA: Insufficient documentation

## 2013-12-08 DIAGNOSIS — F900 Attention-deficit hyperactivity disorder, predominantly inattentive type: Secondary | ICD-10-CM | POA: Insufficient documentation

## 2013-12-08 DIAGNOSIS — I658 Occlusion and stenosis of other precerebral arteries: Secondary | ICD-10-CM | POA: Insufficient documentation

## 2013-12-08 DIAGNOSIS — R5383 Other fatigue: Secondary | ICD-10-CM | POA: Insufficient documentation

## 2013-12-08 DIAGNOSIS — Z8673 Personal history of transient ischemic attack (TIA), and cerebral infarction without residual deficits: Secondary | ICD-10-CM | POA: Insufficient documentation

## 2013-12-08 DIAGNOSIS — R202 Paresthesia of skin: Secondary | ICD-10-CM | POA: Insufficient documentation

## 2013-12-08 DIAGNOSIS — H539 Unspecified visual disturbance: Secondary | ICD-10-CM | POA: Insufficient documentation

## 2014-01-06 ENCOUNTER — Encounter: Payer: Self-pay | Admitting: Sports Medicine

## 2014-01-06 ENCOUNTER — Ambulatory Visit (INDEPENDENT_AMBULATORY_CARE_PROVIDER_SITE_OTHER): Payer: BC Managed Care – PPO | Admitting: Sports Medicine

## 2014-01-06 VITALS — BP 115/77 | HR 98 | Ht 67.0 in | Wt 140.0 lb

## 2014-01-06 DIAGNOSIS — G8929 Other chronic pain: Secondary | ICD-10-CM

## 2014-01-06 DIAGNOSIS — M722 Plantar fascial fibromatosis: Secondary | ICD-10-CM

## 2014-01-06 DIAGNOSIS — M25559 Pain in unspecified hip: Secondary | ICD-10-CM

## 2014-01-06 DIAGNOSIS — M25552 Pain in left hip: Principal | ICD-10-CM

## 2014-01-06 DIAGNOSIS — N393 Stress incontinence (female) (male): Secondary | ICD-10-CM

## 2014-01-06 NOTE — Assessment & Plan Note (Signed)
This is less painful than before  I reassured her that some of the swelling was normal  Restart the exercises for the plantar fascia  Use compression for the arch

## 2014-01-06 NOTE — Patient Instructions (Signed)
See your OB GYN to reassess that tissue is OK in perineum and that you do not need any surgical repair  Not a cystocoele or rectocoele  Eliezer ChampagneHilda Wolf is the therapist at the urology office who has done some biofeedback for perineum for some of my patients  Hips are working better so keep up strength exercises  Test running on elliptical or treadmill before trying outside  Planks, bridges and core work are still key  See me once you feel you are close to starting some regular running

## 2014-01-06 NOTE — Assessment & Plan Note (Signed)
Return to her OB/GYN for follow up  I did suggest if she keeps having trouble firing her perineal muscles that she may want to try the biofeedback program that they use at urology clinic

## 2014-01-06 NOTE — Progress Notes (Signed)
Patient ID: Jacqueline SimmeringJennifer Shaw, female   DOB: 02/04/1961, 53 y.o.   MRN: [id number]   Victorino DikeJennifer returns for followup of several problems Chronic left hip pain is generally better She has been doing her home exercises She is able to bike more than she has for a number of years Last week she did 120 miles  She has not started back much running but left partially because of the partial plantar fascial tear in her left foot She is concerned with this because at the end of tried to run it there will be some swelling in the arch Pain is no longer in the heel and generally is less She has not done her home exercises for this  Major issue for her is stress incontinence that is made worse with running She has at times had some problems with bowel control I gave her pelvic floor exercises and she says that does help some Her OB/GYN have started her back on hormones and has her in some physical therapy  Stress levels remain quite high because of the difficult divorce  Physical examination Muscular female who appears in a more positive mood and in no acute distress BP 115/77  Pulse 98  Ht 5\' 7"  (1.702 m)  Wt 140 lb (63.504 kg)  BMI 21.92 kg/m2   Hips bilat ROM - normal I Strength IR: 5/5, ER: 5/5, Flexion: 5/5, Extension: 5/5, Abduction: 5/5, Adduction: 5/5 Pelvic alignment unremarkable to inspection and palpation. Standing hip rotation and gait without trendelenburg / unsteadiness. Greater trochanter without tenderness to palpation. No tenderness over piriformis and greater trochanter. No SI joint tenderness and normal minimal SI movement.  Much better firing of the gluteus maximus Better firing of hamstrings with easy jogging  Left arch is nontender There is a slight defect along the plantar fashion on the left about 4 cm distal to the insertion at the heel Does remain slightly puffy  Isometric abdominal contraction is strong and the same is true for buttocks mm Difficulty firing  perineal muscles

## 2014-01-06 NOTE — Assessment & Plan Note (Signed)
This is improving as she gets her strength better and today her SI joint function was much improved  Biking is helpful  We will reevaluate this as she gets back into running

## 2014-04-14 ENCOUNTER — Ambulatory Visit (INDEPENDENT_AMBULATORY_CARE_PROVIDER_SITE_OTHER): Payer: BC Managed Care – PPO | Admitting: Sports Medicine

## 2014-04-14 ENCOUNTER — Encounter: Payer: Self-pay | Admitting: Sports Medicine

## 2014-04-14 VITALS — BP 135/78 | HR 68 | Ht 67.0 in | Wt 140.0 lb

## 2014-04-14 DIAGNOSIS — M722 Plantar fascial fibromatosis: Secondary | ICD-10-CM

## 2014-04-14 DIAGNOSIS — R269 Unspecified abnormalities of gait and mobility: Secondary | ICD-10-CM

## 2014-04-14 NOTE — Progress Notes (Signed)
  Jacqueline SimmeringJennifer Shaw - 53 y.o. female MRN [id number]  Date of birth: 10/28/1960  SUBJECTIVE:  Including CC & ROS.  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 She has experienced this in the past particularly back in July 2015. However she feels that this episode was worse. She reports that this past weekend she was running up a hill when she felt a extreme pain in her left plantar fascia and heel of her foot which required her to stop running. She described a sensation of a pop in this region. Ever since this injury patient's had difficulty with walking has not been able to run and even had some pain with a bike ride a 54 miles is week. Patient has been treating with ibuprofen and icing with some relief. She has continued up her stretching regimen that she was on previously for plantar fasciitis.    ROS: Review of systems otherwise negative except for information present in HPI  HISTORY: Past Medical, Surgical, Social, and Family History Reviewed & Updated per EMR. Pertinent Historical Findings include: Chronic hip pain issues, plantar fasciitis, stress incontinence,  PHYSICAL EXAM:  VS: BP:135/78 mmHg  HR:68bpm  TEMP: ( )  RESP:   HT:5\' 7"  (170.2 cm)   WT:140 lb (63.504 kg)  BMI:22 FOOT EXAM:  General: well nourished Skin of LE: warm; dry, no rashes, lesions, ecchymosis or erythema. Vascular: Dorsal pedal pulses 2+ bilaterally Neurologically: Sensation to light touch lower extremities equal and intact bilaterally.  Observation - no ecchymosis, no edema, or hematoma present  Palpation: tenderness over the plantar fascia and insertion site Limited ankle motion 10 deg loss of ext, flex, 5 degree loss of inver/ eversion Ankle strength intact but weak in all directions Extension/flexion 4/5 strength bilaterally in toes Weight-bearing foot exam:  First ray: neutral  Second ray: neutral Longitudinal  arch: higher right than left Heel position: valgus Gait analysis: difficulty to evaluate because of pain  MSK US: Hypoechoic changes throughout the lumbar fascia insertion indicating evidence of partial tear, presence of a small heel spur at the plantar fascial insertion site. 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  ASSESSMENT & PLAN: See problem based charting & AVS for pt instructions.

## 2014-04-14 NOTE — Assessment & Plan Note (Signed)
Further rupture of partial plantar fascial worse than previous with significant hypoechoic changes seen on ultrasound consistent with swelling edema  Recommendations: -Patient was given a body helix ankle sleeve to help provide compression help with swelling -Patient was given reading sports insoles with additional padding with scaphoid pad and heel lift -Will follow-up with patient in 2 weeks to fit her for custom orthotics -Patient was recommended to do heel lifts bilateral 3-4 times a day -Patient was encouraged not to do any running and continue crosstraining with biking and swimming as tolerated

## 2014-04-30 ENCOUNTER — Encounter: Payer: Self-pay | Admitting: Sports Medicine

## 2014-04-30 ENCOUNTER — Ambulatory Visit (INDEPENDENT_AMBULATORY_CARE_PROVIDER_SITE_OTHER): Payer: BC Managed Care – PPO | Admitting: Sports Medicine

## 2014-04-30 VITALS — BP 108/76 | HR 76 | Ht 67.0 in | Wt 140.0 lb

## 2014-04-30 DIAGNOSIS — R269 Unspecified abnormalities of gait and mobility: Secondary | ICD-10-CM | POA: Diagnosis not present

## 2014-04-30 DIAGNOSIS — M722 Plantar fascial fibromatosis: Secondary | ICD-10-CM

## 2014-04-30 NOTE — Progress Notes (Signed)
  Jacqueline SimmeringJennifer Shaw - 53 y.o. female MRN [id number]  Date of birth: 05/02/1961  SUBJECTIVE:  Including CC & ROS.  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  At our last office visit patient's ultrasound exam revealed evidence of a partial tear and small heel spur at the plantar fascia insertion to the calcaneus. Recommended treatment included compression ankle sleeve, discontinuation of running, with continued crosstraining of swimming and biking, patient was given HEP that included heel rises and toe walks. Patient was also provided with insoles arch support and heel lifts.  Today the patient reports that she has continued crosstraining with no significant difficulty. However she has not participated in her home exercise program. Patient also reports that she has not been wearing her tennis shoes or using her insoles that were padded for her specifically for this injury. Patient reports that she's been wearing sandals. She continues to have significant pain within her arch. She describes the sensation in her arch as a "ball rolled up". She also complains of some tenderness along the lateral foot particularly over the base of the fifth metatarsal. She continues to treat with some ice and ibuprofen.   ROS: Review of systems otherwise negative except for information present in HPI  HISTORY: Past Medical, Surgical, Social, and Family History Reviewed & Updated per EMR. Pertinent Historical Findings include: Chronic hip pain issues, plantar fasciitis, stress incontinence  PHYSICAL EXAM:  VS: BP:108/76 mmHg  HR:76bpm  TEMP: ( )  RESP:   HT:5\' 7"  (170.2 cm)   WT:140 lb (63.504 kg)  BMI:22 FOOT EXAM:  General: well nourished Skin of LE: warm; dry, no rashes, lesions, ecchymosis or erythema. Vascular: Dorsal pedal pulses 2+ bilaterally Neurologically: Sensation to light touch lower extremities equal and  intact bilaterally.  Observation - no ecchymosis, no edema, or hematoma present  Palpation: tenderness over the plantar fascia and insertion site and base of 5th metatarsal Limited ankle motion 10 deg loss of ext, flex, 5 degree loss of inver/ eversion Ankle strength intact but weak in all directions Extension/flexion 4/5 strength bilaterally in toes Weight-bearing foot exam:  First ray: neutral  Second ray: neutral Longitudinal arch: higher right than left Heel position: valgus Gait analysis: difficulty to evaluate because of pain  ASSESSMENT & PLAN: See problem based charting & AVS for pt instructions. Orthotic Fitting and Adjustment note: Patient was fitted for a : standard, cushioned, semi-rigid orthotic.  The orthotic was heated and afterward the patient stood on the orthotic blank positioned on the orthotic stand.  The patient was positioned in subtalar neutral position and 10 degrees of ankle dorsiflexion in a weight bearing stance.  After completion of molding, a stable base was applied to the orthotic blank.  The blank was ground to a stable position for weight bearing.  Size: 7 for tennis shoe and 6 for biking shoes Base: Blue EVA Posting: medial wedge on the left to increase arch support Additional orthotic padding: none  Greater than 50% of the patient's visit for a total of 40 minutes, was spent conducting face-to-face counseling for bilateral foot orthotics fitting and constructing

## 2014-05-01 NOTE — Assessment & Plan Note (Signed)
Patient presented today to follow-up on her left foot patella fascial rupture. Today's visit was to address patient's foot biomechanics with custom orthotics to provide additional arch support and padding. Patient felt clinical improvement from wearing the orthotics and had improvement in her gait analysis with less supination now that additional arch support was applied to her orthotic to maintain the ankle in subtalar neutral.  Patient will follow-up in 2-3 weeks after continuing cross-training and once she is able to gradually returning to walking/running interval training once she has pain-free walking.

## 2014-06-18 ENCOUNTER — Telehealth: Payer: Self-pay | Admitting: Sports Medicine

## 2014-06-18 ENCOUNTER — Ambulatory Visit (INDEPENDENT_AMBULATORY_CARE_PROVIDER_SITE_OTHER): Payer: BC Managed Care – PPO | Admitting: Sports Medicine

## 2014-06-18 ENCOUNTER — Encounter: Payer: Self-pay | Admitting: Sports Medicine

## 2014-06-18 ENCOUNTER — Ambulatory Visit
Admission: RE | Admit: 2014-06-18 | Discharge: 2014-06-18 | Disposition: A | Discharge status: Home / Self Care | Payer: BC Managed Care – PPO | Source: Ambulatory Visit | Attending: Sports Medicine | Admitting: Sports Medicine

## 2014-06-18 VITALS — BP 117/76 | HR 67 | Ht 67.0 in | Wt 140.0 lb

## 2014-06-18 DIAGNOSIS — R269 Unspecified abnormalities of gait and mobility: Secondary | ICD-10-CM

## 2014-06-18 DIAGNOSIS — M722 Plantar fascial fibromatosis: Secondary | ICD-10-CM | POA: Diagnosis not present

## 2014-06-18 DIAGNOSIS — M8438XG Stress fracture, other site, subsequent encounter for fracture with delayed healing: Secondary | ICD-10-CM

## 2014-06-18 DIAGNOSIS — M84376A Stress fracture, unspecified foot, initial encounter for fracture: Secondary | ICD-10-CM | POA: Insufficient documentation

## 2014-06-18 NOTE — Telephone Encounter (Signed)
Spoke with patient over the phone to notify her the results of her foot and ankle x-rays reveal a stress fracture at the calcaneus on the plantar aspect proximally where the plantar fascia tissue inserts. Notify patient that in order to treat this we recommend she start nonweightbearing and also wear a cam walking boot. Because her office is close tomorrow advised patient to come in on Monday to be fitted for a cam walking boot and also be fitted for crutches. We'll also write a prescription for a knee walker if patient would desire using this device for ambulation while not weightbearing. Patient will remain nonweightbearing for at least 4 weeks will repeat x-rays at that time. Patient verbalized understanding and will see her on Monday.

## 2014-06-20 NOTE — Assessment & Plan Note (Signed)
Patient present today with continue pain of the left plantar foot for several months with no improvement from conservative treatment including rest, cross-training, stretching, icing, NSAIDS, orthotics.   Recommendation: - further evaluation with xray and MRI -Xray was found to have a stress fracture to the the plantar aspect of the calcaneous  - Spoke with patient about the results over the phone and recommend starting non-WB over the weekend and come in to the office Monday for CAM walking boot and crutch.

## 2014-06-20 NOTE — Progress Notes (Signed)
  [name] - 54 y.o. female MRN [id number]  Date of birth: 11-Jan-1961  SUBJECTIVE:  Including CC & ROS.  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  04/14/14: ultrasound exam revealed evidence of a partial tear and small heel spur at the plantar fascia insertion to the calcaneus. Recommended treatment included compression ankle sleeve, discontinuation of running, with continued crosstraining of swimming and biking, patient was given HEP that included heel rises and toe walks. Patient was also provided with insoles arch support and heel lifts.  04/30/14: Patient continued crosstraining with no significant difficulty. Patient also reports that she has not been wearing her tennis shoes or using her insoles that were padded for her specifically for this injury. Patient reports that she's been wearing sandals. She continues to have significant pain within her arch. Patient was fitted for custom orthotic.   06/18/14: patient reports continue severe pain in her ache and plantar fascia with radiating pain around the lateral malleolus which she describes the pain as constant dull ache with sharp pain with even walking. She has discontinues running and biking. She reports only feeling comfortable in wearing saddle with a heel and has not been wear her orthotics.   ROS: Review of systems otherwise negative except for information present in HPI  HISTORY: Past Medical, Surgical, Social, and Family History Reviewed & Updated per EMR. Pertinent Historical Findings include: Chronic hip pain issues, plantar fasciitis, stress incontinence  PHYSICAL EXAM:  VS: BP:117/76 mmHg  HR:67bpm  TEMP: ( )  RESP:   HT:5\' 7"  (170.2 cm)   WT:140 lb (63.504 kg)  BMI:22 Left FOOT EXAM:  General: well nourished Skin of LE: warm; dry, no rashes, lesions, ecchymosis or erythema. Vascular: Dorsal pedal pulses 2+  bilaterally Neurologically: Sensation to light touch lower extremities equal and intact bilaterally.  Observation - no ecchymosis, no edema, or hematoma present  Palpation: tenderness over the plantar fascia and insertion  Limited ankle motion 10 deg loss of ext, flex, 5 degree loss of inver/ eversion Ankle strength intact but weak in all directions Extension/flexion 4/5 strength bilaterally in toes Weight-bearing foot exam:  First ray: neutral  Second ray: neutral Longitudinal arch: higher right than left Heel position: valgus Gait analysis: difficulty to evaluate because of pain  ASSESSMENT & PLAN: See problem based charting & AVS for pt instructions.

## 2014-06-22 ENCOUNTER — Other Ambulatory Visit: Payer: Self-pay | Admitting: *Deleted

## 2014-06-22 ENCOUNTER — Telehealth: Payer: Self-pay | Admitting: Sports Medicine

## 2014-06-22 DIAGNOSIS — M25552 Pain in left hip: Principal | ICD-10-CM

## 2014-06-22 DIAGNOSIS — G8929 Other chronic pain: Secondary | ICD-10-CM

## 2014-06-22 NOTE — Telephone Encounter (Signed)
Please note that this is a brief office encounter and not a telephone note  Patient came in today at my request to discuss x-ray findings of her left foot. Her x-ray suggest a possible stress fracture in her left calcaneus. It is in an unusual location. I've suggested that we go ahead and proceed with MRI as ordered previously. In the meantime she will be nonweightbearing on crutches.  She is also complaining of chronic lateral left hip pain. Review of the chart shows that she has had extensive physical therapy as well as a cortisone injection but continues to have pain that limits her ability to function. She would like to proceed with further diagnostic imaging. Therefore, we will start with plain x-rays and if unremarkable we'll proceed with an MRI scan of her left hip.  She is also concerned about osteoporosis. She has not had a DEXA scan done in 5 years. She would like to go ahead and get an updated study. I will go ahead and order that study as well.  Phone follow-up next week to discuss the MRI findings of her left foot. If stress fracture is confirmed she will need to be nonweightbearing on her crutches for several weeks. She had a follow-up appointment scheduled with Dr. Darrick Penna for next week but we will reschedule that for a later date.

## 2014-06-22 NOTE — Addendum Note (Signed)
Addended by: Annita Brod on: 06/22/2014 09:55 AM   Modules accepted: Orders

## 2014-06-26 ENCOUNTER — Other Ambulatory Visit: Payer: Self-pay | Admitting: Sports Medicine

## 2014-06-26 ENCOUNTER — Ambulatory Visit
Admission: RE | Admit: 2014-06-26 | Discharge: 2014-06-26 | Disposition: A | Discharge status: Home / Self Care | Payer: BLUE CROSS/BLUE SHIELD | Source: Ambulatory Visit | Attending: Sports Medicine | Admitting: Sports Medicine

## 2014-06-26 DIAGNOSIS — G8929 Other chronic pain: Secondary | ICD-10-CM

## 2014-06-26 DIAGNOSIS — M25552 Pain in left hip: Principal | ICD-10-CM

## 2014-06-26 DIAGNOSIS — M722 Plantar fascial fibromatosis: Secondary | ICD-10-CM

## 2014-06-28 ENCOUNTER — Ambulatory Visit
Admission: RE | Admit: 2014-06-28 | Discharge: 2014-06-28 | Disposition: A | Discharge status: Home / Self Care | Payer: BLUE CROSS/BLUE SHIELD | Source: Ambulatory Visit | Attending: Sports Medicine | Admitting: Sports Medicine

## 2014-06-28 DIAGNOSIS — G8929 Other chronic pain: Secondary | ICD-10-CM

## 2014-06-28 DIAGNOSIS — M25552 Pain in left hip: Principal | ICD-10-CM

## 2014-06-29 ENCOUNTER — Other Ambulatory Visit: Payer: Self-pay | Admitting: *Deleted

## 2014-06-29 DIAGNOSIS — M8438XG Stress fracture, other site, subsequent encounter for fracture with delayed healing: Secondary | ICD-10-CM

## 2014-06-30 ENCOUNTER — Telehealth: Payer: Self-pay | Admitting: Sports Medicine

## 2014-06-30 ENCOUNTER — Other Ambulatory Visit: Payer: Self-pay | Admitting: *Deleted

## 2014-06-30 ENCOUNTER — Other Ambulatory Visit: Payer: Self-pay

## 2014-06-30 ENCOUNTER — Ambulatory Visit: Payer: BC Managed Care – PPO | Admitting: Sports Medicine

## 2014-06-30 ENCOUNTER — Ambulatory Visit
Admission: RE | Admit: 2014-06-30 | Discharge: 2014-06-30 | Disposition: A | Discharge status: Home / Self Care | Payer: BLUE CROSS/BLUE SHIELD | Source: Ambulatory Visit | Attending: Sports Medicine | Admitting: Sports Medicine

## 2014-06-30 DIAGNOSIS — M8438XG Stress fracture, other site, subsequent encounter for fracture with delayed healing: Secondary | ICD-10-CM

## 2014-06-30 NOTE — Telephone Encounter (Signed)
I spoke with the patient on the phone today after reviewing the MRI of her left foot as well as x-rays and an MRI of her left hip. The MRI of her left foot does in fact confirm a nondisplaced complete stress fracture through the plantar aspect of the body of the calcaneus with surrounding marrow edema. She also has findings consistent with plantar fasciitis. The plain x-rays of her left hip showed a small area of calcification off of the lateral acetabulum but the MRI scan of her pelvis did not show any significant labral pathology. She does have some proximal hamstring tendinosis and partial tearing and the patient tells me that the majority of her hip pain is in fact in this area. She is currently nonweightbearing on crutches and I have reinforced the importance of remaining this way until this stress fracture is completely healed. She understands that if she is noncompliant that that will increase the possibility of malunion or nonunion. She will follow-up with Dr Santiago Glad in 4 weeks and we will get a follow-up x-ray of her left calcaneus prior to that visit. I also discussed the possibility of topical nitroglycerin and hamstring rehabilitation for her chronic proximal hamstring tendinopathy once her stress fracture has healed. She will call with questions or concerns prior to her follow-up visit.

## 2014-07-01 ENCOUNTER — Telehealth: Payer: Self-pay | Admitting: *Deleted

## 2014-07-01 NOTE — Telephone Encounter (Signed)
Called pt and explained the info below. Had questions about wearing a boot but will hold off for now.

## 2014-07-01 NOTE — Telephone Encounter (Signed)
-----   Message from Ralene Cork, DO sent at 07/01/2014 10:34 AM EST ----- Regarding: Bone Density results (DEXA scan) Please call her and tell her that her bone density test is normal. No signs of osteoporosis. This is good news and we can discuss this further at her follow up visit in February.  ----- Message -----    From: Rad Results In Interface    Sent: 06/30/2014   4:43 PM      To: Ralene Cork, DO

## 2014-07-01 NOTE — Telephone Encounter (Signed)
-----   Message from Timothy R Draper, DO sent at 07/01/2014 10:34 AM EST ----- Regarding: Bone Density results (DEXA scan) Please call her and tell her that her bone density test is normal. No signs of osteoporosis. This is good news and we can discuss this further at her follow up visit in February.  ----- Message -----    From: Rad Results In Interface    Sent: 06/30/2014   4:43 PM      To: Timothy R Draper, DO    

## 2014-07-17 ENCOUNTER — Ambulatory Visit
Admission: RE | Admit: 2014-07-17 | Discharge: 2014-07-17 | Disposition: A | Discharge status: Home / Self Care | Payer: BLUE CROSS/BLUE SHIELD | Source: Ambulatory Visit | Attending: Sports Medicine | Admitting: Sports Medicine

## 2014-07-17 DIAGNOSIS — M8438XG Stress fracture, other site, subsequent encounter for fracture with delayed healing: Secondary | ICD-10-CM

## 2014-07-20 ENCOUNTER — Ambulatory Visit (INDEPENDENT_AMBULATORY_CARE_PROVIDER_SITE_OTHER): Payer: BLUE CROSS/BLUE SHIELD | Admitting: Sports Medicine

## 2014-07-20 ENCOUNTER — Encounter: Payer: Self-pay | Admitting: Sports Medicine

## 2014-07-20 VITALS — BP 122/85 | Ht 67.0 in | Wt 150.0 lb

## 2014-07-20 DIAGNOSIS — M8438XD Stress fracture, other site, subsequent encounter for fracture with routine healing: Secondary | ICD-10-CM | POA: Diagnosis not present

## 2014-07-20 DIAGNOSIS — R269 Unspecified abnormalities of gait and mobility: Secondary | ICD-10-CM | POA: Diagnosis not present

## 2014-07-20 DIAGNOSIS — M25511 Pain in right shoulder: Secondary | ICD-10-CM

## 2014-07-20 NOTE — Progress Notes (Signed)
   Subjective:    Patient ID: Jacqueline Shaw, female    DOB: [birth year], 54 y.o.   MRN: [id number]  HPI  Patient comes in today for follow-up on left foot pain. Recent x-rays and an MRI showed a nondisplaced calcaneal stress fracture. Diagnosis was 4 weeks ago. X-rays done 3 days ago show a nearly healed fracture. However, she continues to have significant heel pain. She also states that her foot has lost its "flexibility". In addition to this she describes what sounds like a mild foot drop with running. This is been present since the spring. She has been nonweightbearing on crutches since her last office visit. Today she is also complaining of right shoulder pain. No injury. Diffuse in the shoulder and present mainly with overhead reaching. No problems with her shoulder in the past.    Review of Systems     Objective:   Physical Exam Well-developed, well-nourished. No acute distress.  Right shoulder: Full range of motion. Positive painful arc. No tenderness to palpation. Rotator cuff strength is 5/5 but reproducible of pain with resisted supraspinatus. Neurovascularly intact distally.  Right foot: There is tenderness to palpation at the calcaneal insertion of the plantar fascia but no pain with calcaneal squeeze. No tenderness to palpation at the area of her stress fracture. No soft tissue swelling. There is mild calf atrophy due to being nonweightbearing for 4 weeks. Strength is 5/5 in her ankle and foot. Reflexes are equal at the Achilles and patellar tendons.  X-rays of the right calcaneus are as above       Assessment & Plan:  Healing right calcaneal stress fracture Plantar fasciitis, right foot Possible mild exercise-induced foot drop, right foot Right shoulder pain secondary to rotator cuff tendinitis/subacromial bursitis  Patient's calcaneal stress fracture is nearly completely healed. She can wean off of her crutches and begin weightbearing as tolerated. This will hopefully  help her right shoulder pain as well. She can resume swimming and biking but I do not want her to start any powerwalking or running yet. Instead I want to reevaluate her in 3 weeks. I will get a follow-up x-ray a day or two before that visit. The exercise-induced foot drop appears to be chronic. I'm going to get an EMG/nerve conduction study to rule out lumbar radiculopathy or possible peroneal nerve irritation. Hopefully we can get this done before her follow-up visit. I am also going to show her some Jobe exercises for her right shoulder. I discussed the possibility of a subacromial cortisone injection at follow-up if symptoms persist.

## 2014-07-21 ENCOUNTER — Ambulatory Visit: Payer: Self-pay | Admitting: Sports Medicine

## 2014-08-10 ENCOUNTER — Ambulatory Visit: Payer: Self-pay | Admitting: Sports Medicine

## 2014-08-26 ENCOUNTER — Ambulatory Visit: Payer: Self-pay | Admitting: Sports Medicine

## 2014-09-08 ENCOUNTER — Encounter: Payer: Self-pay | Admitting: Sports Medicine

## 2014-09-08 ENCOUNTER — Ambulatory Visit
Admission: RE | Admit: 2014-09-08 | Discharge: 2014-09-08 | Disposition: A | Discharge status: Home / Self Care | Payer: BLUE CROSS/BLUE SHIELD | Source: Ambulatory Visit | Attending: Sports Medicine | Admitting: Sports Medicine

## 2014-09-08 ENCOUNTER — Ambulatory Visit (INDEPENDENT_AMBULATORY_CARE_PROVIDER_SITE_OTHER): Payer: BLUE CROSS/BLUE SHIELD | Admitting: Sports Medicine

## 2014-09-08 VITALS — BP 122/68 | Ht 67.0 in | Wt 150.0 lb

## 2014-09-08 DIAGNOSIS — M722 Plantar fascial fibromatosis: Secondary | ICD-10-CM | POA: Diagnosis not present

## 2014-09-08 DIAGNOSIS — M8438XD Stress fracture, other site, subsequent encounter for fracture with routine healing: Secondary | ICD-10-CM

## 2014-09-08 DIAGNOSIS — M8438XG Stress fracture, other site, subsequent encounter for fracture with delayed healing: Secondary | ICD-10-CM

## 2014-09-08 NOTE — Patient Instructions (Signed)
Stretch plantar fascia 10 seconds x 6 reps in AM when you wake up Ice your heel at the end of a run by submerging your heel in ice water for 10-15 minutes

## 2014-09-08 NOTE — Assessment & Plan Note (Signed)
MSK ultrasound today shows L plantar fascia thickness of 0.54 cm -Ice heel 10-15 mins after each run -Stretches 10 secs x 6 each AM -removed medial wedge from custom orthotic today -f/u in 4 weeks

## 2014-09-08 NOTE — Assessment & Plan Note (Signed)
Resolved on xray and symptomatically. No restrictions, resume running gradually.

## 2014-09-08 NOTE — Progress Notes (Signed)
   HPI:  Patient presents to follow-up on left heel pain. She recently resumed running. Has lost most of her fitness from being out of running for the last several months. She's running up to 30 minutes at a time now. She still has pain in her left heel. It is on the plantar aspect and more lateral than medial. It only hurts whenever pressure is applied. Is worse in the morning. It is better, but still hurting some. She has not been stretching or icing.  ROS: See HPI  PMFSH: hx recent calcaneal stress fracture, left plantar fasciitis  PHYSICAL EXAM: BP 122/68 mmHg  Ht  (1.702 m)  Wt 150 lb (68.04 kg)  BMI 23.49 kg/m2 Gen: NAD, pleasant, cooperative MSK:  L ankle: no effusion. Neg calcaneal squeeze test. Mild TTP over plantar surface of heel, more lateral than medial  X-ray of the left foot done earlier today shows complete resolution of her previous calcaneal stress fracture.  ASSESSMENT/PLAN:  Stress fracture of calcaneus Resolved on xray and symptomatically. No restrictions, resume running gradually.   Plantar fasciitis of left foot MSK ultrasound today shows L plantar fascia thickness of 0.54 cm -Ice heel 10-15 mins after each run -Stretches 10 secs x 6 each AM -removed medial wedge from custom orthotic today -f/u in 4 weeks    FOLLOW UP: F/u in 4 weeks for L plantar fasciitis  SIGNED: [place] J. Pollie Meyer, MD Family Medicine Resident PGY-3 [facility]   Patient was seen and evaluated with the above-named resident. I agree with the plan of care. Follow-up with me in 4 weeks. It will be interesting to see if removing the medial heel wedge from her left orthotic helps to resolve her symptoms.

## 2014-10-12 ENCOUNTER — Ambulatory Visit (INDEPENDENT_AMBULATORY_CARE_PROVIDER_SITE_OTHER): Payer: BLUE CROSS/BLUE SHIELD | Admitting: Sports Medicine

## 2014-10-12 ENCOUNTER — Encounter: Payer: Self-pay | Admitting: Sports Medicine

## 2014-10-12 VITALS — BP 127/77 | Ht 67.0 in | Wt 150.0 lb

## 2014-10-12 DIAGNOSIS — R269 Unspecified abnormalities of gait and mobility: Secondary | ICD-10-CM | POA: Diagnosis not present

## 2014-10-12 NOTE — Progress Notes (Signed)
   Subjective:    Patient ID: Jacqueline Shaw, female    DOB: 1961/01/08, 54 y.o.   MRN: [id number]  HPI   Patient comes in today for follow-up on plantar fasciitis. Still having pain but she admits that she has not been doing her exercises or icing as instructed. She is going through a rather nasty divorce and has not really been concentrating on her foot pain. She did not notice any significant change with removing the medial heel wedge. She is also complaining of a "floppy" left foot. This was initially present with running but is now also noticeable with walking. She's noticed some weakness as well. She also has a history of low back pain. No numbness or tingling.    Review of Systems     Objective:   Physical Exam Well-developed, well-nourished  Left foot: Still tenderness to palpation at the calcaneal insertion of the plantar fascia. Negative calcaneal squeeze. Walking without a limp.  Neurological exam: Patient has a 3/4 patellar and Achilles reflex on the right. She has a 2/4 patellar reflex on the left, 3/4 Achilles. No weakness however with dorsi flexion, eversion, or inversion. Walking without a noticeable foot drop.       Assessment & Plan:  Left foot plantar fasciitis Questionable intermittent left foot drop   Reassurance regarding her plantar fasciitis. I reiterated the importance of stretching and icing. Also reassured her that this will eventually burn itself out. The left foot drop has me concerned so I want to order an EMG/nerve conduction study to further evaluate. I want to see whether or not this is originating from her lumbar spine or from a more distal location such as the peroneal nerve. I will call her with those results once available.

## 2014-10-23 ENCOUNTER — Other Ambulatory Visit: Payer: Self-pay | Admitting: *Deleted

## 2014-10-23 DIAGNOSIS — M722 Plantar fascial fibromatosis: Secondary | ICD-10-CM

## 2014-12-07 ENCOUNTER — Ambulatory Visit (INDEPENDENT_AMBULATORY_CARE_PROVIDER_SITE_OTHER): Payer: Self-pay | Admitting: Neurology

## 2014-12-07 ENCOUNTER — Ambulatory Visit (INDEPENDENT_AMBULATORY_CARE_PROVIDER_SITE_OTHER): Payer: BLUE CROSS/BLUE SHIELD | Admitting: Neurology

## 2014-12-07 DIAGNOSIS — M25572 Pain in left ankle and joints of left foot: Secondary | ICD-10-CM

## 2014-12-07 DIAGNOSIS — Z0289 Encounter for other administrative examinations: Secondary | ICD-10-CM

## 2014-12-08 DIAGNOSIS — M25579 Pain in unspecified ankle and joints of unspecified foot: Secondary | ICD-10-CM | POA: Insufficient documentation

## 2014-12-08 NOTE — Procedures (Signed)
   NCS (NERVE CONDUCTION STUDY) WITH EMG (ELECTROMYOGRAPHY) REPORT  STUDY DATE: December 07 2014 PATIENT NAME: Jacqueline Shaw DOB: 11-10-1960 MRN: [id number]    TECHNOLOGIST: Gearldine Shown ELECTROMYOGRAPHER: Levert Feinstein M.D.  CLINICAL INFORMATION:  54 year old [employer], complains of intermittent left plantar foot pain, frequent injury to left foot  FINDINGS: NERVE CONDUCTION STUDY:  Bilateral peroneal sensory responses were normal. Bilateral peroneal to EDB, tibial motor responses were normal. Bilateral tibial H reflexes were normal and symmetric.  NEEDLE ELECTROMYOGRAPHY:  Selected needle examination was performed at left lower extremity muscles, and left lumbosacral paraspinal muscles.  Needle examination of left tibialis anterior, tibialis posterior, vastus lateralis, biceps femoris long head, gluteus medius was normal.  There was no spontaneous activity at left lumbosacral paraspinals, left L4. She was not able to tolerate more extensive needle examinations    IMPRESSION:  This is a normal study. There was no electrodiagnostic evidence of large fiber peripheral neuropathy, in specifics, there was no evidence of left lower extremity neuropathy, or left lumbar sacral radiculopathy.   INTERPRETING PHYSICIAN:   Levert Feinstein M.D. Ph.D. [facility] [address] [place], [place] [address] [phone]

## 2014-12-09 NOTE — Progress Notes (Signed)
Normal EMG nerve conduction study today, no evidence of peripheral neuropathy, or left lumbar radiculopathy

## 2015-01-14 ENCOUNTER — Encounter: Payer: Self-pay | Admitting: Sports Medicine

## 2015-02-10 ENCOUNTER — Telehealth: Payer: Self-pay | Admitting: Sports Medicine

## 2015-02-10 NOTE — Telephone Encounter (Signed)
I've attempted a couple of times to contact the patient regarding a recent EMG/nerve conduction study that was performed on her left lower extremity. I've left a message on her voicemail regarding the results. It is an unremarkable study. It is unclear to me whether or not Dr.Ibazebo reviewed this with her so I've asked her to call my office with any questions about the study.

## 2015-02-12 ENCOUNTER — Encounter: Payer: Self-pay | Admitting: Sports Medicine

## 2015-04-19 DIAGNOSIS — N3289 Other specified disorders of bladder: Secondary | ICD-10-CM | POA: Insufficient documentation

## 2015-05-18 DIAGNOSIS — E559 Vitamin D deficiency, unspecified: Secondary | ICD-10-CM | POA: Insufficient documentation

## 2015-05-18 DIAGNOSIS — M791 Myalgia, unspecified site: Secondary | ICD-10-CM | POA: Insufficient documentation

## 2015-05-20 ENCOUNTER — Ambulatory Visit
Admission: RE | Admit: 2015-05-20 | Discharge: 2015-05-20 | Disposition: A | Discharge status: Home / Self Care | Payer: BLUE CROSS/BLUE SHIELD | Source: Ambulatory Visit | Attending: Sports Medicine | Admitting: Sports Medicine

## 2015-05-20 ENCOUNTER — Ambulatory Visit (INDEPENDENT_AMBULATORY_CARE_PROVIDER_SITE_OTHER): Payer: BLUE CROSS/BLUE SHIELD | Admitting: Sports Medicine

## 2015-05-20 ENCOUNTER — Encounter: Payer: Self-pay | Admitting: Sports Medicine

## 2015-05-20 VITALS — BP 120/78 | Ht 67.0 in | Wt 142.0 lb

## 2015-05-20 DIAGNOSIS — M25552 Pain in left hip: Secondary | ICD-10-CM

## 2015-05-20 NOTE — Progress Notes (Addendum)
Patient ID: Jacqueline Shaw, female   DOB: [birth year], 54 y.o.   MRN: [id number] Jacqueline Shaw is a 54 year old with a history of a calcaneus stress fracture who presents to clinic with a 1 month history of left hip/groin pain. Patient states this pain has come on gradually and does not recall any specific injury. She describes her pain as deep in the left groin that radiates to the posterior aspect of the hip. She also notes she has pain over the greater trochanter. She does body pumping, cycling, running, and swimming. She notes she took a few weeks off a month ago from body pumping and then when she returned restarted with the same weights as prior to stopping and wonders if this could be when she hurt it. She states she is only running 10 miles a week. She notes this past Saturday she did hip abduction and adduction exercises and the pain significantly worsen. She notes she feels very weak in her hip muscles.  Patient does notes she is currently taking calcium and vitamin D and has had these levels checked with her primary care physician.  PMHx: calcaneus stress fracture  Review of Systems: (+) left hip/groin pain, greater trochanter pain, hip weakness (-) back pain, numbness or tingling  Physical Examination: BP 120/78 mmHg  Ht  (1.702 m)  Wt 142 lb (64.411 kg)  BMI 22.24 kg/m2 Constitutional: Well-appearing, well-nourished, no acute distress Gait: Walking without a limp Left hip exam: Full ROM with pain elicited with FADIR and log roll, FABER negative. 5/5 strength of tensor fasciae lata, 3/5 strength of gluteus medius, 4/5 strength of gluteus maximus. Pain with one legged hop.  Right hip exam: Full ROM with no pain elicited, FADIR and FABER negative, 5/5 strength of tensor fasciae lata, 4/5 strength of gluteus medius, 5/5 strength of gluteus maximus. Back exam: Normal lumbar curvature. Negative straight leg test and reverse straight leg test.  Neurological: Patella and Achilles DTRs 2+  bilaterally, Sensation to light touch intact  Assessment and Plan: 54 year old female with: 1. Left groin pain, concerned for a stress fracture 2. Recent calcaneal stress fracture - will obtain a left hip AP & lateral x-ray. If negative, proceed with MRI - will have patient follow up after the MRI and we will delineate definitive treatment based on those results   X-rays reviewed. She does have some mild degenerative changes in her left hip. Proceed with MRI to rule out femoral neck stress fracture.

## 2015-05-25 ENCOUNTER — Ambulatory Visit
Admission: RE | Admit: 2015-05-25 | Discharge: 2015-05-25 | Disposition: A | Discharge status: Home / Self Care | Payer: BLUE CROSS/BLUE SHIELD | Source: Ambulatory Visit | Attending: Sports Medicine | Admitting: Sports Medicine

## 2015-05-25 DIAGNOSIS — M25552 Pain in left hip: Secondary | ICD-10-CM

## 2015-06-02 ENCOUNTER — Other Ambulatory Visit: Payer: BLUE CROSS/BLUE SHIELD

## 2015-06-03 ENCOUNTER — Encounter: Payer: Self-pay | Admitting: Sports Medicine

## 2015-06-03 ENCOUNTER — Ambulatory Visit (INDEPENDENT_AMBULATORY_CARE_PROVIDER_SITE_OTHER): Payer: BLUE CROSS/BLUE SHIELD | Admitting: Sports Medicine

## 2015-06-03 VITALS — BP 128/66 | Ht 67.0 in | Wt 142.0 lb

## 2015-06-03 DIAGNOSIS — M25552 Pain in left hip: Secondary | ICD-10-CM

## 2015-06-04 NOTE — Progress Notes (Signed)
   Subjective:    Patient ID: Jacqueline Shaw, female    DOB: [birth year], 54 y.o.   MRN: [id number]  HPI   Patient comes in today to discuss MRI findings of the left hip. No stress fracture. She does have mild to moderate gluteus medius and gluteus minimus tendinopathy. She continues to complain of chronic diffuse left hip pain. This has been present now for many months.    Review of Systems     Objective:   Physical Exam Well-developed, well-nourished. No acute distress  Left hip: Negative log roll. Diffuse tenderness to palpation along the lateral hip. Significant hip abductor weakness with resistance. Neurovascularly intact distally. Walking without a limp.  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 I've explained to her that she must get this stronger in order for her hip pain to resolve. I think she would be best served working under the direction of a physical therapist so I have referred her to [employer]. I've reassured her that she is okay to continue with activity as tolerated using pain as her guide. Follow-up with me in 6 weeks for reevaluation.

## 2015-07-15 ENCOUNTER — Ambulatory Visit: Payer: BLUE CROSS/BLUE SHIELD | Admitting: Sports Medicine

## 2015-08-09 DIAGNOSIS — R0982 Postnasal drip: Secondary | ICD-10-CM | POA: Insufficient documentation

## 2015-08-09 DIAGNOSIS — Z0000 Encounter for general adult medical examination without abnormal findings: Secondary | ICD-10-CM | POA: Insufficient documentation

## 2015-08-11 ENCOUNTER — Ambulatory Visit (INDEPENDENT_AMBULATORY_CARE_PROVIDER_SITE_OTHER): Payer: BLUE CROSS/BLUE SHIELD | Admitting: Sports Medicine

## 2015-08-11 ENCOUNTER — Encounter: Payer: Self-pay | Admitting: Sports Medicine

## 2015-08-11 VITALS — BP 130/75 | HR 59 | Ht 67.0 in | Wt 142.0 lb

## 2015-08-11 DIAGNOSIS — G8929 Other chronic pain: Secondary | ICD-10-CM | POA: Diagnosis not present

## 2015-08-11 DIAGNOSIS — M25552 Pain in left hip: Secondary | ICD-10-CM

## 2015-08-11 NOTE — Progress Notes (Signed)
   Subjective:    Patient ID: Jacqueline Shaw, female    DOB: Jan 08, 1961, 55 y.o.   MRN: [id number]  HPI   Patient comes in today for follow-up on her left hip gluteus medius tendinopathy. Overall, her pain has improved. She has not been working with a physical therapist but, instead, got some exercises from the Internet and has been doing those. She feels like they have been helpful. Her main complaint today is bilateral anterior knee pain which began after cycling 150 miles in 4 days. She also has a new bike. She has not noticed any swelling in her knees. Her pain is most noticeable when going from a seated to a standing position. No mechanical symptoms.    Review of Systems     Objective:   Physical Exam  Well-developed, fit appearing. No acute distress  Left hip: Patient still has significant left hip weakness with resisted hip abduction. No tenderness to palpation. Negative log roll.  Examination of each knee shows full range of motion. No effusion. No tenderness to palpation along the quadriceps nor the patellar tendons. Negative patellar grind. No significant patellofemoral crepitus. No joint line tenderness. Good joint stability. Walking without a limp.      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 She is asking about deep tissue massage and I think she can try that if she would like. For her bilateral knee chondromalacia patella I have cautioned her about "overdoing it". I think that her knee pain as a result of riding 150 miles in 4 days and an improperly fitted bike. I recommended that the patient take her new bike for a proper bike fitting and she needs to limit her riding based on pain. She understands. She will follow-up with me as needed.  Total time spent with the patient  was 15 minutes with greater than 50% of the time spent in face-to-face consultation discussing her diagnoses and treatment.

## 2016-01-18 ENCOUNTER — Ambulatory Visit (INDEPENDENT_AMBULATORY_CARE_PROVIDER_SITE_OTHER): Payer: BLUE CROSS/BLUE SHIELD | Admitting: Sports Medicine

## 2016-01-18 ENCOUNTER — Encounter: Payer: Self-pay | Admitting: Sports Medicine

## 2016-01-18 VITALS — BP 125/78 | Ht 67.0 in | Wt 136.0 lb

## 2016-01-18 DIAGNOSIS — M65859 Other synovitis and tenosynovitis, unspecified thigh: Secondary | ICD-10-CM

## 2016-01-18 DIAGNOSIS — M76899 Other specified enthesopathies of unspecified lower limb, excluding foot: Secondary | ICD-10-CM

## 2016-01-18 MED ORDER — METHYLPREDNISOLONE ACETATE 80 MG/ML IJ SUSP
80.0000 mg | Freq: Once | INTRAMUSCULAR | Status: AC
  Administered 2016-01-18: 80 mg via INTRAMUSCULAR

## 2016-01-18 NOTE — Progress Notes (Signed)
   Subjective:    Patient ID: Jacqueline Shaw , female   DOB: 1961-06-02 , 55 y.o..   MRN: [id number]  HPI  Jacqueline Shaw is here for right leg pain.  - Patient states she is having pain in her right leg on her "sit bone" and hamstring that started around December 2016- January 2017 when she got a new bike seat.  - She rides her bike frequently and that is when she feels the most pain.  - Recently she has been dreading riding her bike just because it hurts so bad to sit.  - Has tried 3 different bike seats since the pain started but none of the seats seem to help.  - Notes that all of her bike seats are hard as she was told the cushioned ones could make her pain worse.  - Also of note, she is also concerned about a bump in her right groin area that popped up last week that is is related to the leg pain.  - Aggravating factors: walking, sitting for any amount of time, and leg extension.  - Alleviating factors: Advil. Pain relived with 2 pills but she tries to get by with 1. Does not take Advil daily. - Denies weakness, numbness, or paresthesias.   Past Medical History: Patient Active Problem List   Diagnosis Date Noted  . Pain in joint, ankle and foot 12/08/2014  . Stress fracture of calcaneus 06/18/2014  . Plantar fasciitis of left foot 11/27/2013  . Abnormality of gait 10/28/2013  . Stress incontinence 10/28/2013  . Palpitations 10/09/2013  . Dyspnea 10/09/2013  . Depression 10/15/2012  . Chronic left hip pain 05/30/2012  . Concussion syndrome 02/14/2011  . Muscular fatigue 02/14/2011  . CLOSED FRACTURE OF METATARSAL BONE 01/12/2010      Objective:   BP 125/78   Ht 5\' 7"  (1.702 m)   Wt 136 lb (61.7 kg)   BMI 21.30 kg/m  Physical Exam  Gen: NAD, alert, cooperative with exam, well-appearing MSK: Left leg: No swelling or erythema. Non tender to palpation. Full range of motion. Normal strength. Neurovascularly intact.  Right leg: No swelling or erythema. Mild tenderness to  palpation of ischial spine. Pain with knee flexion at 30 degrees but not past 90 degrees. Normal range of motion. Normal strength. Neurovascularly intact.  Skin: ~1cm pustule with surround erythema and mild swelling on right groin     Assessment & Plan:   1. 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  - Provided patient with piriformis and hamstring stretches handout and demonstrated how to perform while in exam room. - Discussed changing to a more cushioned bike seat - Steroid injection today - Ibuprofen 200 mg BID x 5 days then PRN - Return to clinic in 6 weeks  2. Pimple or ingrown hair of groin - Warm compresses daily - See PCP if does not resolve in 1 week, if erythema worsens or if fever develops  Patient seen and evaluated with the resident. I agree with the above plan of care. Jacqueline Shaw has some ischial tuberosity bursitis and proximal hamstring tendinopathy. I recommended that she experiment with her bike seat to try a more cushioned seat and we will inject her with 80 mg of Depo-Medrol IM today. She is educated in [place] exercises and will continue with prn ibuprofen. Follow-up in 6 weeks for reevaluation.

## 2016-02-28 ENCOUNTER — Ambulatory Visit (INDEPENDENT_AMBULATORY_CARE_PROVIDER_SITE_OTHER): Payer: BLUE CROSS/BLUE SHIELD | Admitting: Sports Medicine

## 2016-02-28 ENCOUNTER — Encounter: Payer: Self-pay | Admitting: Sports Medicine

## 2016-02-28 VITALS — BP 132/73 | HR 66 | Ht 67.0 in | Wt 136.0 lb

## 2016-02-28 DIAGNOSIS — M65859 Other synovitis and tenosynovitis, unspecified thigh: Secondary | ICD-10-CM | POA: Diagnosis not present

## 2016-02-28 DIAGNOSIS — M76899 Other specified enthesopathies of unspecified lower limb, excluding foot: Secondary | ICD-10-CM

## 2016-02-28 NOTE — Progress Notes (Signed)
   Subjective:    Patient ID: Jacqueline Shaw, female    DOB: May 06, 1961, 55 y.o.   MRN: [id number]  HPI   Patient comes in today for follow-up on chronic right hamstring tendinopathy. She still struggling with pain. Her pain is worse at the beginning of activity as well as at the end. It does tend to "loosen up" during her workouts. She continues to localize the pain to the proximal to midportion of her hamstring. She's having similar issues in the left hamstring. IM Depo-Medrol injection did not help her.    Review of Systems     Objective:   Physical Exam  Well-developed, fit appearing. No acute distress  Right hamstring: There is tenderness to palpation along the proximal and midportion of the hamstring. No palpable defect. Pain and weakness with resisted hamstring flexion at 30 but good strength at 90. She is tender to palpation at the ischial tuberosity as well. Neurovascularly intact distally. Walking without a limp.       Assessment & Plan:   Chronic hamstring tendinopathy  Patient would benefit greatly from formal physical therapy. I will set her up with Ellamae Sia and Amado Coe. If she would like to try some dry needling, I'm okay with that as well. I would like to see her back in the office in 6 weeks for reevaluation. In the meantime, I think she can continue with activity using pain as her guide.

## 2016-04-04 DIAGNOSIS — J45909 Unspecified asthma, uncomplicated: Secondary | ICD-10-CM | POA: Insufficient documentation

## 2016-04-04 DIAGNOSIS — J45991 Cough variant asthma: Secondary | ICD-10-CM | POA: Insufficient documentation

## 2016-04-10 ENCOUNTER — Ambulatory Visit (INDEPENDENT_AMBULATORY_CARE_PROVIDER_SITE_OTHER): Payer: BLUE CROSS/BLUE SHIELD | Admitting: Sports Medicine

## 2016-04-10 ENCOUNTER — Encounter: Payer: Self-pay | Admitting: Sports Medicine

## 2016-04-10 VITALS — BP 120/81 | HR 66 | Ht 67.0 in | Wt 138.0 lb

## 2016-04-10 DIAGNOSIS — M76899 Other specified enthesopathies of unspecified lower limb, excluding foot: Secondary | ICD-10-CM | POA: Diagnosis not present

## 2016-04-10 NOTE — Progress Notes (Signed)
   Subjective:    Patient ID: Jacqueline SimmeringJennifer Shaw, female    DOB: 09/19/1960, 55 y.o.   MRN: [id number]  HPI Victorino DikeJennifer comes in today for follow-up on right hamstring tendinopathy. Her hamstring pain is improving. Today she is having pain more in the front of her hips. She's also complaining of some generalized weakness and fatigue with activity. This tends to be worse in the heat. Despite this, she continues to stay very active. She was able to run/walk 7 miles and ride her bike for an hour this past Saturday. She is concerned that she may be developing osteoarthritis of the hip and may need a hip replacement.       Review of Systems     Objective:   Physical Exam  Well-developed, fit appearing. No acute distress   examination of each hip shows full painless hip range of motion. No tenderness to palpation. Good flexibility. Neurovascularly intact distally. Walking without a limp.      Assessment & Plan:    Improving hamstring tendinopathy  I reassured her that she does not have any hip arthritis. She does not need to worry about a hip replacement anytime in the near future. We had previously talked about formal physical therapy but she would like to continue home exercises on her own. I think she can continue with activity as tolerated. I also think she would benefit from a nutritional consult with Wyona AlmasJeannie Sykes. Patient will contact her directly. Follow-up with me as needed.

## 2016-07-27 DIAGNOSIS — R053 Chronic cough: Secondary | ICD-10-CM | POA: Insufficient documentation

## 2016-08-23 DIAGNOSIS — M542 Cervicalgia: Secondary | ICD-10-CM | POA: Insufficient documentation

## 2016-10-02 ENCOUNTER — Other Ambulatory Visit: Payer: Self-pay | Admitting: Obstetrics and Gynecology

## 2016-10-02 DIAGNOSIS — N9489 Other specified conditions associated with female genital organs and menstrual cycle: Secondary | ICD-10-CM

## 2016-10-02 DIAGNOSIS — N838 Other noninflammatory disorders of ovary, fallopian tube and broad ligament: Secondary | ICD-10-CM

## 2016-10-05 ENCOUNTER — Other Ambulatory Visit: Payer: BLUE CROSS/BLUE SHIELD

## 2017-02-10 IMAGING — MR MR HIP*L* W/O CM
4 of 5 series · 17 of 40 positions shown · non-contrast
Comparison: Left hip radiographs 05/20/2015

CLINICAL DATA: Left hip and groin pain for several years. Patient
is a triathlete.

EXAM:
MR OF THE LEFT HIP WITHOUT CONTRAST
TECHNIQUE: Multiplanar, multisequence MR imaging was performed. No intravenous
contrast was administered.

[Series 6: T1 · coronal · 4.0mm · 0.59mm/px · 3 of 19 slices shown (1 of 2)]
[im 3/19]
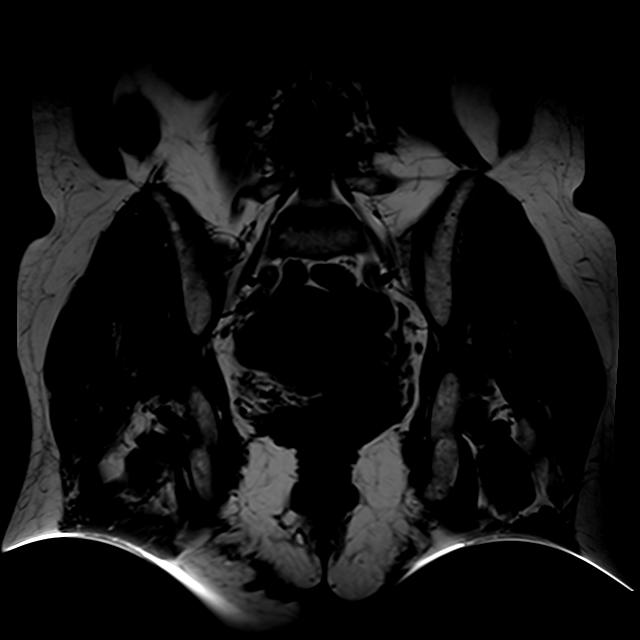
[im 11/19]
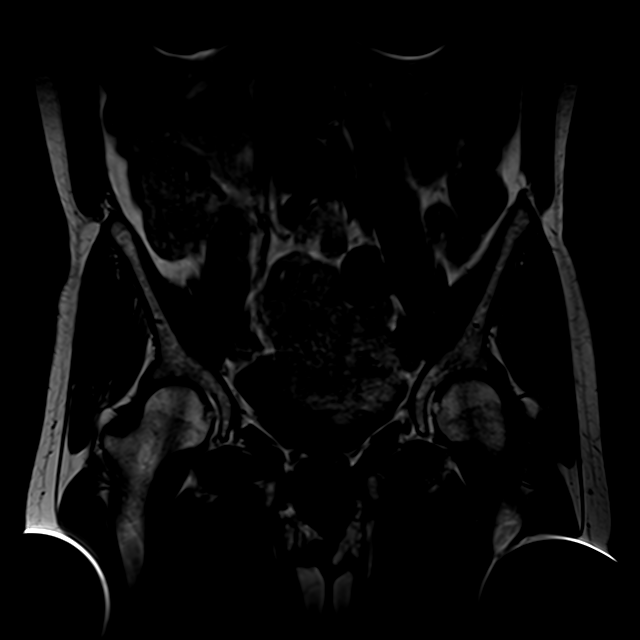
[im 16/19]
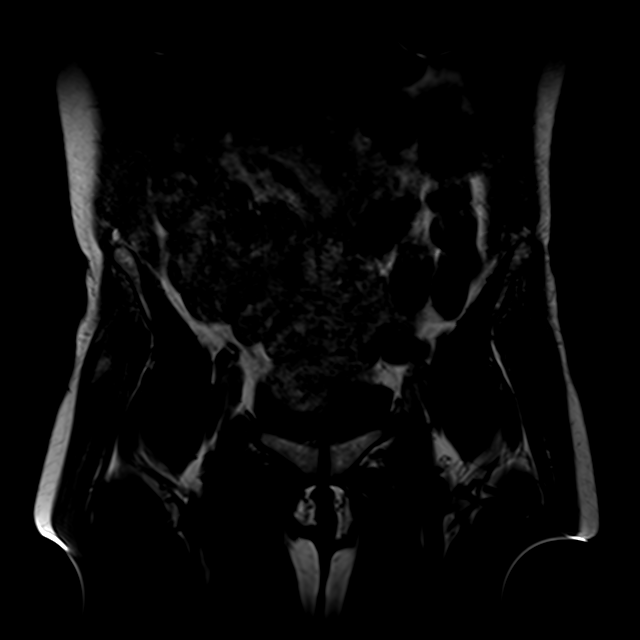

[Series 7: T2 fat-sat · axial · 4.0mm · 0.59mm/px · z∈[-103,-33]mm · 3 of 20 slices shown]
[im 3/20]
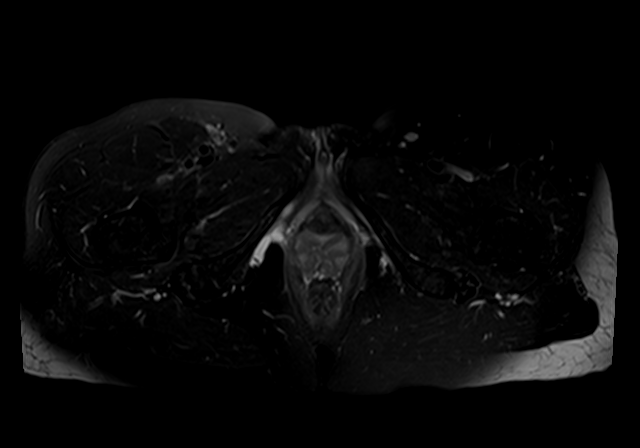
[im 11/20]
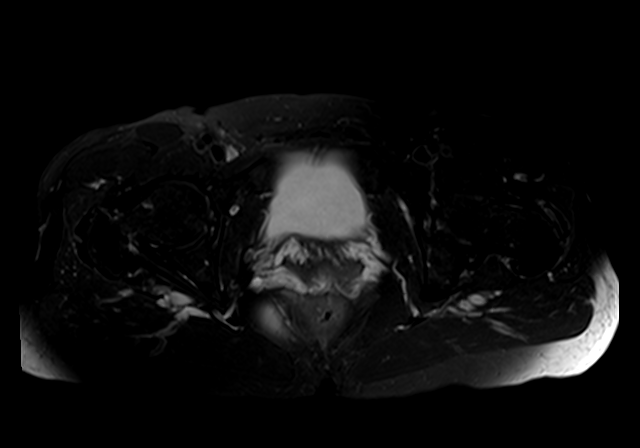
[im 17/20]
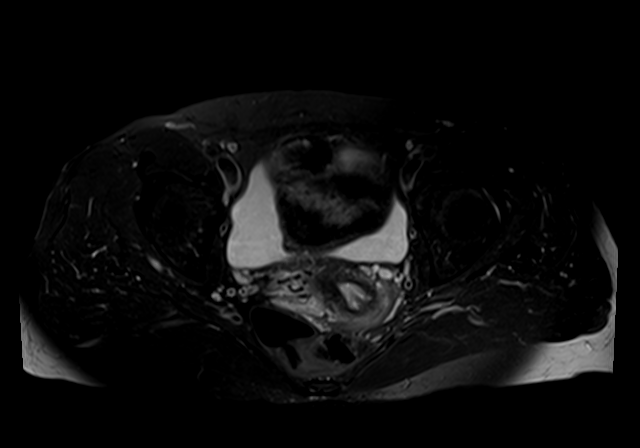

[Series 8: T1 · axial · 4.0mm · 0.59mm/px · z∈[-103,-33]mm · 3 of 20 slices shown (2 of 2)]
[im 3/20]
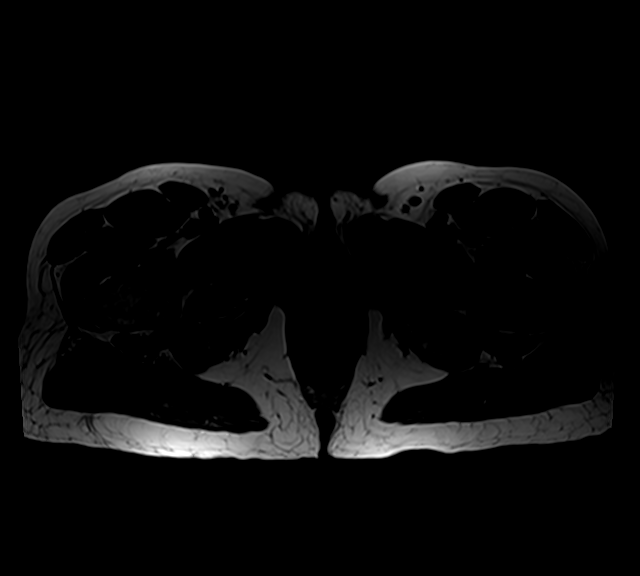
[im 11/20]
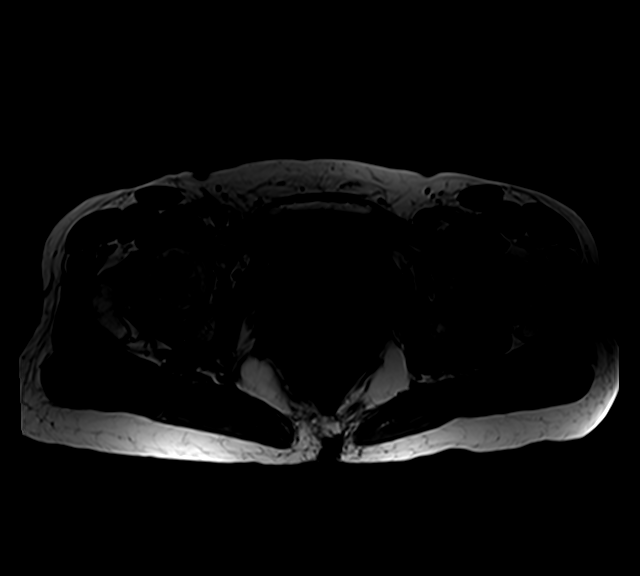
[im 17/20]
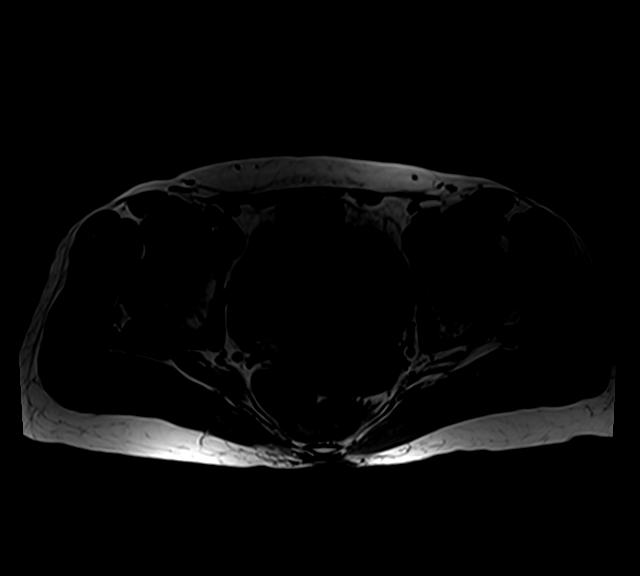

[Series 9: PD fat-sat · sagittal · 4.0mm · 0.33mm/px · 8 of 19 slices shown]
[im 1/19]
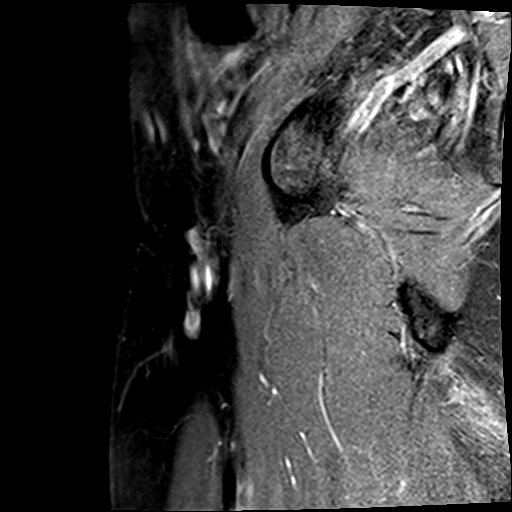
[im 3/19]
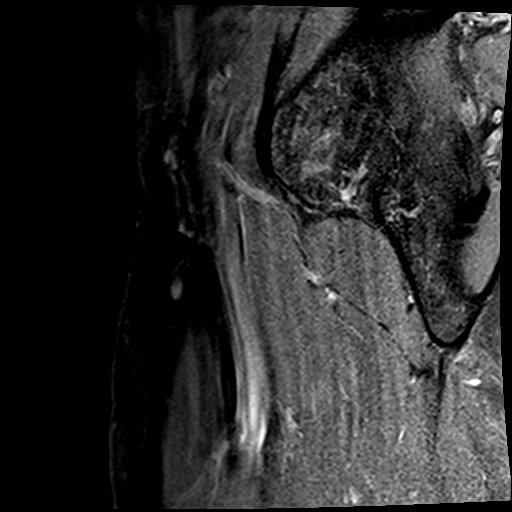
[im 6/19]
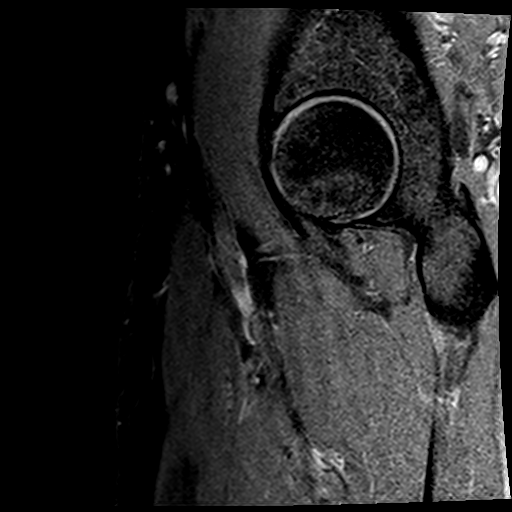
[im 8/19]
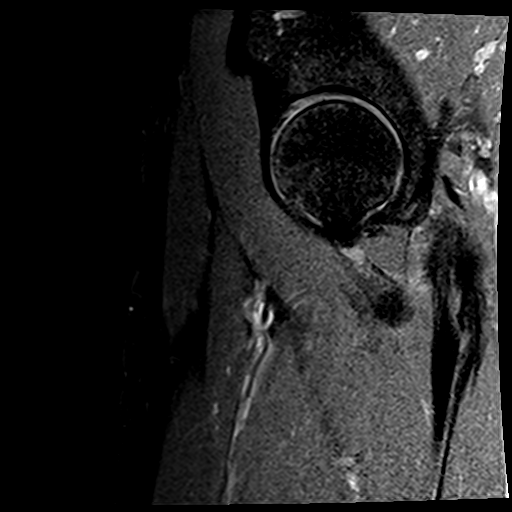
[im 11/19]
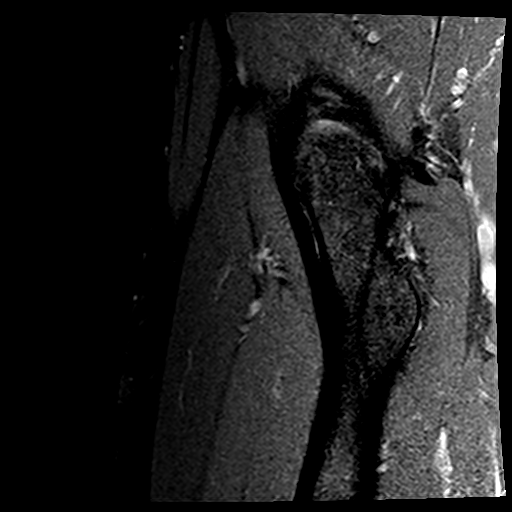
[im 13/19]
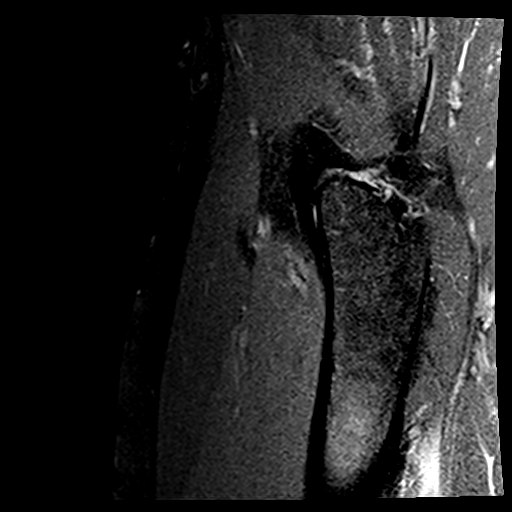
[im 16/19]
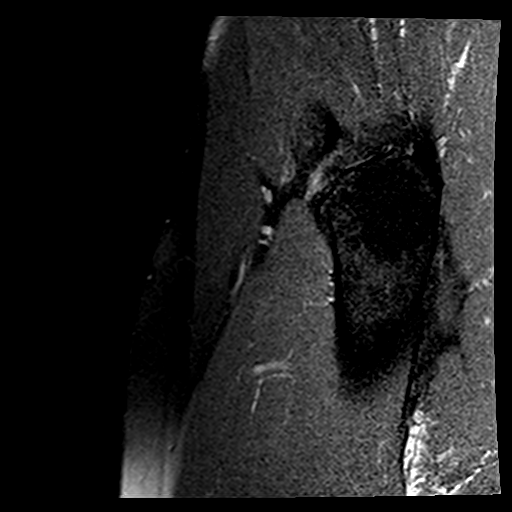
[im 19/19]
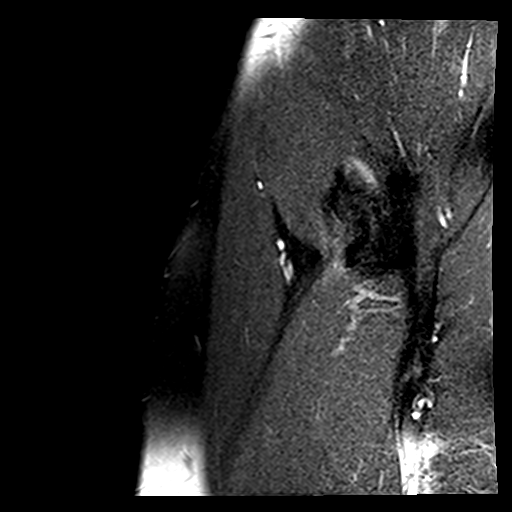

[17 of 40 positions shown; findings below may reference images not displayed]

FINDINGS: Both hips are normally located. No stress fracture or AVN. No joint
effusion or periarticular fluid collections to suggest a paralabral
cyst. Mild to moderate bilateral peritendinosis, mainly involving
the gluteus minimus and medius tendons. No trochanteric bursitis.
The hip and pelvic musculature appears normal. No muscle tear,
myositis or fatty atrophy.

The pubic symphysis and SI joints are intact. No pelvic fractures or
bone lesions.

No significant intrapelvic abnormalities. No inguinal mass or
hernia.
IMPRESSION: 1. Moderate bilateral peritendinitis involving the gluteus medius
and minimus tendons. No trochanteric bursitis.
2. No hip fracture or AVN.  Mild degenerative changes.
3. No significant intrapelvic abnormalities.

## 2017-02-15 ENCOUNTER — Ambulatory Visit (INDEPENDENT_AMBULATORY_CARE_PROVIDER_SITE_OTHER): Payer: Managed Care, Other (non HMO) | Admitting: Sports Medicine

## 2017-02-15 DIAGNOSIS — S42022A Displaced fracture of shaft of left clavicle, initial encounter for closed fracture: Secondary | ICD-10-CM

## 2017-02-15 DIAGNOSIS — F0781 Postconcussional syndrome: Secondary | ICD-10-CM | POA: Diagnosis not present
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
## 2017-02-20 ENCOUNTER — Ambulatory Visit: Payer: BLUE CROSS/BLUE SHIELD | Admitting: Sports Medicine

## 2017-02-20 ENCOUNTER — Ambulatory Visit: Payer: Self-pay | Admitting: Sports Medicine

## 2017-03-22 DIAGNOSIS — M1990 Unspecified osteoarthritis, unspecified site: Secondary | ICD-10-CM | POA: Insufficient documentation

## 2017-07-26 DIAGNOSIS — S2239XA Fracture of one rib, unspecified side, initial encounter for closed fracture: Secondary | ICD-10-CM | POA: Insufficient documentation

## 2017-07-31 ENCOUNTER — Other Ambulatory Visit: Payer: Self-pay | Admitting: Orthopedic Surgery

## 2017-07-31 DIAGNOSIS — S42022D Displaced fracture of shaft of left clavicle, subsequent encounter for fracture with routine healing: Secondary | ICD-10-CM

## 2017-08-03 ENCOUNTER — Ambulatory Visit
Admission: RE | Admit: 2017-08-03 | Discharge: 2017-08-03 | Disposition: A | Discharge status: Home / Self Care | Payer: Managed Care, Other (non HMO) | Source: Ambulatory Visit | Attending: Orthopedic Surgery | Admitting: Orthopedic Surgery

## 2017-08-03 DIAGNOSIS — S42022D Displaced fracture of shaft of left clavicle, subsequent encounter for fracture with routine healing: Secondary | ICD-10-CM

## 2017-11-14 ENCOUNTER — Ambulatory Visit: Payer: Managed Care, Other (non HMO) | Attending: Orthopedic Surgery | Admitting: Physical Therapy

## 2017-11-14 DIAGNOSIS — S42032K Displaced fracture of lateral end of left clavicle, subsequent encounter for fracture with nonunion: Secondary | ICD-10-CM

## 2017-11-14 DIAGNOSIS — R293 Abnormal posture: Secondary | ICD-10-CM | POA: Insufficient documentation

## 2017-11-14 DIAGNOSIS — M6281 Muscle weakness (generalized): Secondary | ICD-10-CM | POA: Diagnosis present

## 2017-11-14 DIAGNOSIS — Z9889 Other specified postprocedural states: Secondary | ICD-10-CM | POA: Diagnosis not present
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
## 2017-11-14 NOTE — Patient Instructions (Signed)
Low Row: Standing    Face anchor, feet shoulder width apart. Palms up, pull arms back, squeezing shoulder blades together. Repeat __15-20 times per set. Do _2_ sets per session. Do _7_ days per week. Anchor Height: Waist  http://tub.exer.us/66   Copyright  VHI. All rights reserved.   Resisted Horizontal Abduction: Bilateral    Sit or stand, tubing in both hands, arms out in front. Keeping arms straight, pinch shoulder blades together and stretch arms out. Repeat ___10-20_ times per set. Do __2__ sets per session. Do ___2_ sessions per day.  http://orth.exer.us/969   Copyright  VHI. All rights reserved.

## 2017-11-15 ENCOUNTER — Encounter: Payer: Self-pay | Admitting: Physical Therapy

## 2017-11-20 ENCOUNTER — Ambulatory Visit: Payer: BLUE CROSS/BLUE SHIELD | Attending: Orthopedic Surgery | Admitting: Physical Therapy

## 2017-11-20 DIAGNOSIS — Z9889 Other specified postprocedural states: Secondary | ICD-10-CM | POA: Insufficient documentation

## 2017-11-20 DIAGNOSIS — X58XXXD Exposure to other specified factors, subsequent encounter: Secondary | ICD-10-CM | POA: Insufficient documentation

## 2017-11-20 DIAGNOSIS — S42032K Displaced fracture of lateral end of left clavicle, subsequent encounter for fracture with nonunion: Secondary | ICD-10-CM | POA: Diagnosis present

## 2017-11-20 DIAGNOSIS — R293 Abnormal posture: Secondary | ICD-10-CM | POA: Insufficient documentation

## 2017-11-20 DIAGNOSIS — M6281 Muscle weakness (generalized): Secondary | ICD-10-CM
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
## 2017-11-22 ENCOUNTER — Ambulatory Visit: Payer: BLUE CROSS/BLUE SHIELD | Admitting: Physical Therapy

## 2017-11-22 DIAGNOSIS — Z9889 Other specified postprocedural states: Secondary | ICD-10-CM

## 2017-11-22 DIAGNOSIS — R293 Abnormal posture: Secondary | ICD-10-CM

## 2017-11-22 DIAGNOSIS — S42032K Displaced fracture of lateral end of left clavicle, subsequent encounter for fracture with nonunion: Secondary | ICD-10-CM

## 2017-11-22 DIAGNOSIS — M6281 Muscle weakness (generalized): Secondary | ICD-10-CM

## 2017-11-22 NOTE — Therapy (Signed)
[facility] Outpatient Rehabilitation [facility] [address] [place], [place], [address] Phone: [phone]   Fax:  [phone]  Physical Therapy Treatment  Patient Details  Name: Jacqueline Shaw MRN: [id number] Date of Birth: March 10, 1961 Referring Provider: Dr. Stormy Card   Encounter Date: 11/22/2017  PT End of Session - 11/22/17 0915    Visit Number  3    Number of Visits  6    Date for PT Re-Evaluation  12/19/17    PT Start Time  0850    PT Stop Time  0930    PT Time Calculation (min)  40 min    Activity Tolerance  Patient tolerated treatment well    Behavior During Therapy  [facility] for tasks assessed/performed       Past Medical History:  Diagnosis Date  . Asthma   . Chronic left hip pain   . CLOSED FRACTURE OF METATARSAL BONE   . Concussion syndrome   . Depression   . Muscular fatigue   . Stroke [facility])     No past surgical history on file.  There were no vitals filed for this visit.  Subjective Assessment - 11/22/17 0912    Subjective  Pt arriving to therapy today reporting no pain and relief following last therapy session. Pt reporting one bike ride since last visit with no complaints of shoulder pain, but reporting hip and leg pain.     Limitations  Lifting;House hold activities;Other (comment)    Diagnostic tests  Recent XR, displaced fracture healing    Patient Stated Goals  Prepare for weight training.     Currently in Pain?  No/denies    Pain Onset  More than a month ago    Pain Frequency  Intermittent                       [facility] Adult PT Treatment/Exercise - 11/22/17 0001      Shoulder Exercises: Prone   Other Prone Exercises  Y, T, I, and W's on therapy ball x 10-15 reps      Shoulder Exercises: Standing   Other Standing Exercises  Posture exercises in front of mirror with shoulder retraction      Manual Therapy   Manual Therapy  Soft tissue mobilization    Soft tissue mobilization  STW to bilateral Upper Traps and  levators, R upper trap trigger point release             PT Education - 11/22/17 0915    Education provided  Yes    Education Details  HEP    Person(s) Educated  Patient    Methods  Explanation;Demonstration;Handout    Comprehension  Verbalized understanding;Returned demonstration          PT Long Term Goals - 11/22/17 0911      PT LONG TERM GOAL #1   Title  Pt will be I with HEP for posture, stability in cervical spine, scapula, ROM    Time  6    Status  On-going      PT LONG TERM GOAL #2   Title  Pt will be able to lift 10 lbs overhead with no difficulty in preparation for weight lifting    Time  6    Period  Weeks    Status  New      PT LONG TERM GOAL #3   Title  Pt will be able to hold a plank for 30 sec without increasing shoulder pain  Time  6    Period  Weeks    Status  New      PT LONG TERM GOAL #4   Title  Pt will have no pain with home activities including yardwork, lifting using both UEs.     Time  6    Period  Weeks    Status  New      PT LONG TERM GOAL #5   Title  FOTO score will improve to 25% limited to demo improved functional activity.     Time  6    Period  Weeks    Status  New            Plan - 11/22/17 0903    Clinical Impression Statement  Pt arrivng to therapy with no pain and reporting no pain and no stiffness at end of session following STW and heat. Pt progressing with her HEP and doing well. Continue to progress towards goals set.     PT Frequency  2x / week    PT Duration  4 weeks    PT Treatment/Interventions  Taping;Therapeutic activities;Therapeutic exercise;Patient/family education;Manual techniques;Dry needling;Cryotherapy;Moist Heat;Iontophoresis 4mg /ml Dexamethasone;Neuromuscular re-education;ADLs/Self Care Home Management    PT Next Visit Plan  check HEP, manual/soft tissue work, scapular area, posture and core strengthening    PT Home Exercise Plan  prone, standing row, wall stretch/posture, wall press with 5  second hold    Consulted and Agree with Plan of Care  Patient       Patient will benefit from skilled therapeutic intervention in order to improve the following deficits and impairments:  Postural dysfunction, Pain, Impaired UE functional use, Increased fascial restricitons, Decreased strength, Decreased mobility  Visit Diagnosis: Abnormal posture  Muscle weakness (generalized)  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 Displaced fracture of shaft of left clavicle, initial encounter for closed fracture 02/15/2017  . Pain in joint, ankle and foot 12/08/2014  . Stress fracture of calcaneus 06/18/2014  . Plantar fasciitis of left foot 11/27/2013  . Abnormality of gait 10/28/2013  . Stress incontinence 10/28/2013  . Palpitations 10/09/2013  . Dyspnea 10/09/2013  . Depression 10/15/2012  . Chronic left hip pain 05/30/2012  . Concussion syndrome 02/14/2011  . Muscular fatigue 02/14/2011  . CLOSED FRACTURE OF METATARSAL BONE 01/12/2010    Sharmon Leyden, MPT 11/22/2017, 9:45 AM  [facility] [address] [place], [place], [address] Phone: [phone]   Fax:  [phone]  Name: Jacqueline Shaw MRN: [id number] Date of Birth: 01-Jul-1960

## 2017-11-27 ENCOUNTER — Ambulatory Visit: Payer: BLUE CROSS/BLUE SHIELD | Admitting: Physical Therapy

## 2017-11-27 DIAGNOSIS — S42032K Displaced fracture of lateral end of left clavicle, subsequent encounter for fracture with nonunion: Secondary | ICD-10-CM

## 2017-11-27 DIAGNOSIS — R293 Abnormal posture: Secondary | ICD-10-CM

## 2017-11-27 DIAGNOSIS — M6281 Muscle weakness (generalized): Secondary | ICD-10-CM

## 2017-11-27 DIAGNOSIS — Z9889 Other specified postprocedural states: Secondary | ICD-10-CM

## 2017-11-27 NOTE — Therapy (Signed)
[facility] Outpatient Rehabilitation [facility] [address] [place], [place], [address] Phone: [phone]   Fax:  [phone]  Physical Therapy Treatment  Patient Details  Name: Jacqueline Shaw MRN: [id number] Date of Birth: [birth year] Referring Provider: Dr. Stormy Card   Encounter Date: 11/27/2017  PT End of Session - 11/27/17 0914    Visit Number  4    Number of Visits  6    Date for PT Re-Evaluation  12/19/17    PT Start Time  0902    PT Stop Time  0940    PT Time Calculation (min)  38 min    Activity Tolerance  Patient tolerated treatment well    Behavior During Therapy  [facility] for tasks assessed/performed       Past Medical History:  Diagnosis Date  . Asthma   . Chronic left hip pain   . CLOSED FRACTURE OF METATARSAL BONE   . Concussion syndrome   . Depression   . Muscular fatigue   . Stroke [facility])     No past surgical history on file.  There were no vitals filed for this visit.  Subjective Assessment - 11/27/17 0902    Subjective  15 min late.  XR showed still not totally healed.  Could do biking.  No swimming.  Will see again in 6-8 weeks.     Currently in Pain?  No/denies       [facility] Adult PT Treatment/Exercise - 11/27/17 0001      Self-Care   Other Self-Care Comments   core, alignment, Pilates community based        Pilates Reformer used for LE/core strength, postural strength, lumbopelvic disassociation and core control.  Exercises included:  Long box Prone 1 Red pulling straps (shoulder ext, horiz abduction and triceps), guided for head neck and shoulder organization.   Seated Arms facing back:  Roll down, cues for articulation  Add row (1 red)  1 Blue goal post x 10, cues to reduce rib flare Seated arms facing front 1 blue   Hug a Tree  X 10   Serving x 10   Quadruped Plank for scapular stab (short box near tower)  1 Red x 10  Added hip flexion with iso hold UE x 10     PT Long Term Goals - 11/27/17 0934      PT  LONG TERM GOAL #1   Title  Pt will be I with HEP for posture, stability in cervical spine, scapula, ROM    Status  On-going      PT LONG TERM GOAL #2   Title  Pt will be able to lift 10 lbs overhead with no difficulty in preparation for weight lifting    Status  On-going      PT LONG TERM GOAL #3   Title  Pt will be able to hold a plank for 30 sec without increasing shoulder pain     Status  On-going      PT LONG TERM GOAL #4   Title  Pt will have no pain with home activities including yardwork, lifting using both UEs.     Status  On-going      PT LONG TERM GOAL #5   Title  FOTO score will improve to 25% limited to demo improved functional activity.     Status  On-going            Plan - 11/27/17 0950    Clinical Impression Statement  Pt arrived late today.  She has no pain but a discomfort in  LUE at rest.  Numbness is still there, did not notify MD but it is improving.  We worked on the [employer] to integrate core and UE alignment, body awareness and strength.  No increased pain.  Min modifcation for spring tension. Goals in progress.     PT Treatment/Interventions  Taping;Therapeutic activities;Therapeutic exercise;Patient/family education;Manual techniques;Dry needling;Cryotherapy;Moist Heat;Iontophoresis 4mg /ml Dexamethasone;Neuromuscular re-education;ADLs/Self Care Home Management    PT Next Visit Plan  manual/soft tissue work, scapular area, posture and core strengthening via Reformer     PT Home Exercise Plan  prone, standing row, wall stretch/posture, wall press with 5 second hold    Consulted and Agree with Plan of Care  Patient       Patient will benefit from skilled therapeutic intervention in order to improve the following deficits and impairments:  Postural dysfunction, Pain, Impaired UE functional use, Increased fascial restricitons, Decreased strength, Decreased mobility  Visit Diagnosis: Abnormal posture  Muscle weakness (generalized)  Closed  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 Displaced fracture of shaft of left clavicle, initial encounter for closed fracture 02/15/2017  . Pain in joint, ankle and foot 12/08/2014  . Stress fracture of calcaneus 06/18/2014  . Plantar fasciitis of left foot 11/27/2013  . Abnormality of gait 10/28/2013  . Stress incontinence 10/28/2013  . Palpitations 10/09/2013  . Dyspnea 10/09/2013  . Depression 10/15/2012  . Chronic left hip pain 05/30/2012  . Concussion syndrome 02/14/2011  . Muscular fatigue 02/14/2011  . CLOSED FRACTURE OF METATARSAL BONE 01/12/2010    Jacqueline Shaw,Jacqueline Shaw 11/27/2017, 9:55 AM  [facility] [address] [place], [place], [address] Phone: [phone]   Fax:  [phone]  Name: Jacqueline Shaw MRN: [id number] Date of Birth: [birth year]  Karie Mainland, PT 11/27/17 9:55 AM Phone: [phone] Fax: [phone]

## 2017-11-29 ENCOUNTER — Encounter: Payer: Self-pay | Admitting: Sports Medicine

## 2017-11-29 ENCOUNTER — Ambulatory Visit (INDEPENDENT_AMBULATORY_CARE_PROVIDER_SITE_OTHER): Payer: Managed Care, Other (non HMO) | Admitting: Sports Medicine

## 2017-11-29 DIAGNOSIS — M792 Neuralgia and neuritis, unspecified: Secondary | ICD-10-CM

## 2017-11-29 DIAGNOSIS — Q667 Congenital pes cavus, unspecified foot: Secondary | ICD-10-CM

## 2017-11-29 DIAGNOSIS — M7742 Metatarsalgia, left foot: Secondary | ICD-10-CM

## 2017-11-29 DIAGNOSIS — M7741 Metatarsalgia, right foot: Secondary | ICD-10-CM | POA: Insufficient documentation

## 2017-11-29 NOTE — Assessment & Plan Note (Signed)
See plan above.

## 2017-11-29 NOTE — Assessment & Plan Note (Signed)
Patient with bilateral pes cavus deformity present today with signs and symptoms consistent with metatarsalgia with associated neuropathic forefoot dysfunction.  All of this is likely related to the pes cavus foot form and the increased forefoot pressure applied.  Patient has notable collapse of bilateral transverse arches upon standing.  Increased pressure to the forefoot will likely cause stress within the soft tissues and irritation to the nerves. -Restart gabapentin 100 to 300 mg at night -Discussed looking into new foot wear for cycling. -Patient to follow-up for custom orthotics with increased padding at the forefoot.  -Patient may also require an additional metatarsal cookie/pad bilaterally. -Discussed avoidance of hard surface running and focus on trails. -Follow-up in 1 to 2 weeks for custom orthotics.

## 2017-11-29 NOTE — Progress Notes (Signed)
   HPI  CC: Bilateral foot pain Patient is here with complaints of bilateral foot pain off-and-on over the past year.  She endorses aching pain and paresthetic-like symptoms along the distal forefoot in her feet and toes over the past year.  She denies any injury or trauma that initiated this pain.  She endorses a long history of other injuries involving her feet but never any that caused paresthetic-like pain.  She also endorses some associated numbness in this area.  Patient is very active in cycling, running, and other athletics.  She is not associated anyone activity with worsening pain but states that prolonged sitting and first getting out of bed in the morning seems to be most associated with worsening discomfort.  Patient also endorses her feet getting cold more so than they used to.  Patient denies any weakness, gait changes, wounds, rashes, or proximal pain.  Medications/Interventions Tried: Relative rest, altering footwear.  See HPI and/or previous note for associated ROS.  Objective: BP 132/88   Ht 5\' 7"  (1.702 m)   Wt 140 lb (63.5 kg)   BMI 21.93 kg/m  Gen: NAD, well groomed, a/o x3, normal affect.  CV: Well-perfused. Warm.  Resp: Non-labored.  Neuro: Sensation intact throughout but with reports of slight decreased sensation in the bilateral forefoot, both dorsal and plantar. No gross coordination deficits.  Gait: Nonpathologic posture, unremarkable stride (forefoot striker) without signs of limp or balance issues. Ankle/Foot, Bilateral: TTP noted at the plantar aspect of the distal forefoot bilaterally without any specific areas of tenderness. No visible erythema, swelling, ecchymosis, or bony deformity.  Soft tissue nodule noted along the medial longitudinal arch of the right foot that was slightly tender to palpation.  Notable pes cavus deformity bilaterally. Transverse arch grossly collapsed bilaterally with obvious toe splaying; No evidence of tibiotalar deviation; Range of  motion is full in all directions. Strength is 5/5 in all directions. No tenderness at the insertion/body/myotendinous junction of the Achilles tendon; No peroneal tendon tenderness or subluxation; No tenderness on posterior aspects of lateral and medial malleolus; Stable lateral and medial ligaments; Talar dome nontender; Unremarkable calcaneal squeeze; No plantar calcaneal tenderness; No tenderness over the navicular prominence; No tenderness over cuboid; No pain at base of 5th MT; Bilateral tenderness at the distal metatarsals; Able to walk 4 steps.   Running gait shows forefoot strike/ good form/ no limp   Assessment and plan:  Pes cavus, congenital Patient with bilateral pes cavus deformity present today with signs and symptoms consistent with metatarsalgia with associated neuropathic forefoot dysfunction.  All of this is likely related to the pes cavus foot form and the increased forefoot pressure applied.  Patient has notable collapse of bilateral transverse arches upon standing.  Increased pressure to the forefoot will likely cause stress within the soft tissues and irritation to the nerves. -Restart gabapentin 100 to 300 mg at night -Discussed looking into new foot wear for cycling. -Patient to follow-up for custom orthotics with increased padding at the forefoot.  -Patient may also require an additional metatarsal cookie/pad bilaterally. -Discussed avoidance of hard surface running and focus on trails. -Follow-up in 1 to 2 weeks for custom orthotics.  Neuropathic pain of foot See plan above  Metatarsalgia of both feet See plan above.   Kathee Delton, MD,MS [facility] Sports Medicine Fellow 11/29/2017 4:09 PM I observed and examined the patient with the [facility] Fellow and agree with assessment and plan.  Note reviewed and modified by me. Enid Baas, MD

## 2017-11-30 ENCOUNTER — Encounter: Payer: Self-pay | Admitting: Physical Therapy

## 2017-11-30 ENCOUNTER — Ambulatory Visit: Payer: BLUE CROSS/BLUE SHIELD | Admitting: Physical Therapy

## 2017-11-30 ENCOUNTER — Other Ambulatory Visit: Payer: Self-pay

## 2017-11-30 DIAGNOSIS — Z9889 Other specified postprocedural states: Secondary | ICD-10-CM

## 2017-11-30 DIAGNOSIS — S42032K Displaced fracture of lateral end of left clavicle, subsequent encounter for fracture with nonunion: Secondary | ICD-10-CM

## 2017-11-30 DIAGNOSIS — M6281 Muscle weakness (generalized): Secondary | ICD-10-CM

## 2017-11-30 DIAGNOSIS — R293 Abnormal posture: Secondary | ICD-10-CM

## 2017-11-30 NOTE — Therapy (Signed)
[facility] Outpatient Rehabilitation [facility] [address] [place], [place], [address] Phone: [phone]   Fax:  [phone]  Physical Therapy Treatment  Patient Details  Name: Jacqueline Shaw MRN: [id number] Date of Birth: [birth year] Referring Provider: Dr. Stormy Card   Encounter Date: 11/30/2017  PT End of Session - 11/30/17 1111    Visit Number  5    Number of Visits  8    Date for PT Re-Evaluation  12/19/17    PT Start Time  1100    PT Stop Time  1150    PT Time Calculation (min)  50 min    Activity Tolerance  Patient tolerated treatment well    Behavior During Therapy  [facility] for tasks assessed/performed       Past Medical History:  Diagnosis Date  . Asthma   . Chronic left hip pain   . CLOSED FRACTURE OF METATARSAL BONE   . Concussion syndrome   . Depression   . Muscular fatigue   . Stroke [facility])     History reviewed. No pertinent surgical history.  There were no vitals filed for this visit.  Subjective Assessment - 11/30/17 1108    Subjective  Saw Dr. Darrick Penna and she feels discouraged.  I feel like I am so deconditioned.  Mild shoulder discomfort.     Currently in Pain?  Yes    Pain Location  Shoulder    Pain Orientation  Left    Pain Descriptors / Indicators  Discomfort    Pain Onset  More than a month ago    Pain Frequency  Intermittent           PT Education - 11/30/17 1237    Education provided  Yes    Education Details  options for Pilates in community , congoing cues for proper core activation     Person(s) Educated  Patient    Methods  Explanation;Demonstration;Verbal cues    Comprehension  Verbalized understanding      Pilates Reformer used for LE/core strength, postural strength, lumbopelvic disassociation and core control.  Exercises included:  Footwork 2red 2 blue  Supine Arm work 1 Red 1 yellow Supine Abs 1 red 1 yellow chest lift      PT Long Term Goals - 11/27/17 0934      PT LONG TERM GOAL #1   Title   Pt will be I with HEP for posture, stability in cervical spine, scapula, ROM    Status  On-going      PT LONG TERM GOAL #2   Title  Pt will be able to lift 10 lbs overhead with no difficulty in preparation for weight lifting    Status  On-going      PT LONG TERM GOAL #3   Title  Pt will be able to hold a plank for 30 sec without increasing shoulder pain     Status  On-going      PT LONG TERM GOAL #4   Title  Pt will have no pain with home activities including yardwork, lifting using both UEs.     Status  On-going      PT LONG TERM GOAL #5   Title  FOTO score will improve to 25% limited to demo improved functional activity.     Status  On-going            Plan - 11/30/17 1239    Clinical Impression Statement  Spent a good amount of session providing encouragement, support as she feels  discouraged a bit, does not know if her current fitness routine may be doing her more long term damage.  No pain increase during session. , noted weakness with L UE abduction.     PT Treatment/Interventions  Taping;Therapeutic activities;Therapeutic exercise;Patient/family education;Manual techniques;Dry needling;Cryotherapy;Moist Heat;Iontophoresis 4mg /ml Dexamethasone;Neuromuscular re-education;ADLs/Self Care Home Management    PT Next Visit Plan  check goals, progress core, posture     PT Home Exercise Plan  prone, standing row, wall stretch/posture, wall press with 5 second hold    Consulted and Agree with Plan of Care  Patient       Patient will benefit from skilled therapeutic intervention in order to improve the following deficits and impairments:  Postural dysfunction, Pain, Impaired UE functional use, Increased fascial restricitons, Decreased strength, Decreased mobility  Visit Diagnosis: Abnormal posture  Muscle weakness (generalized)  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 Pes cavus, congenital 11/29/2017  . Neuropathic pain of foot 11/29/2017  . Metatarsalgia of both feet 11/29/2017  . Displaced fracture of shaft of left clavicle, initial encounter for closed fracture 02/15/2017  . Pain in joint, ankle and foot 12/08/2014  . Stress fracture of calcaneus 06/18/2014  . Plantar fasciitis of left foot 11/27/2013  . Abnormality of gait 10/28/2013  . Stress incontinence 10/28/2013  . Palpitations 10/09/2013  . Dyspnea 10/09/2013  . Depression 10/15/2012  . Chronic left hip pain 05/30/2012  . Concussion syndrome 02/14/2011  . Muscular fatigue 02/14/2011  . CLOSED FRACTURE OF METATARSAL BONE 01/12/2010    PAA,Amelia 11/30/2017, 12:44 PM  [facility] [address] [place], [place], [address] Phone: [phone]   Fax:  [phone]  Name: Jacqueline Shaw MRN: [id number] Date of Birth: 02-Aug-1960  Karie Mainland, PT 11/30/17 12:44 PM Phone: [phone] Fax: [phone]

## 2017-12-04 ENCOUNTER — Ambulatory Visit: Payer: BLUE CROSS/BLUE SHIELD | Admitting: Physical Therapy

## 2017-12-04 DIAGNOSIS — R202 Paresthesia of skin: Secondary | ICD-10-CM | POA: Insufficient documentation

## 2017-12-05 ENCOUNTER — Ambulatory Visit: Payer: BLUE CROSS/BLUE SHIELD | Admitting: Physical Therapy

## 2017-12-10 ENCOUNTER — Ambulatory Visit: Payer: BLUE CROSS/BLUE SHIELD | Admitting: Physical Therapy

## 2017-12-12 ENCOUNTER — Ambulatory Visit (INDEPENDENT_AMBULATORY_CARE_PROVIDER_SITE_OTHER): Payer: BLUE CROSS/BLUE SHIELD | Admitting: Family Medicine

## 2017-12-12 ENCOUNTER — Ambulatory Visit: Payer: BLUE CROSS/BLUE SHIELD | Admitting: Physical Therapy

## 2017-12-12 ENCOUNTER — Encounter: Payer: Self-pay | Admitting: Family Medicine

## 2017-12-12 DIAGNOSIS — Q667 Congenital pes cavus, unspecified foot: Secondary | ICD-10-CM

## 2017-12-12 DIAGNOSIS — S42032K Displaced fracture of lateral end of left clavicle, subsequent encounter for fracture with nonunion: Secondary | ICD-10-CM

## 2017-12-12 DIAGNOSIS — R293 Abnormal posture: Secondary | ICD-10-CM | POA: Diagnosis not present

## 2017-12-12 DIAGNOSIS — Z9889 Other specified postprocedural states: Secondary | ICD-10-CM

## 2017-12-12 DIAGNOSIS — M6281 Muscle weakness (generalized): Secondary | ICD-10-CM

## 2017-12-12 NOTE — Assessment & Plan Note (Signed)
Patient is here with bilateral rigid pes cavus deformity with subsequent forefoot pain.  Here for custom orthotics. -Orthotic with additional forefoot cushioning used today. -Patient jogged and ambulated in these and reported significant comfort. -Follow-up PRN with Dr. Darrick Penna.

## 2017-12-12 NOTE — Progress Notes (Signed)
   HPI  CC: Bilateral pes cavus Patient is here for custom orthotics.  She has been followed by Dr. Darrick Penna and myself in this clinic for foot pain likely related to patient's congenital pes cavus foot form.  Patient states that her symptoms are still present and unchanged since last visit.  She has purchased new jogging shoes and is in the process of finding a new pair of cycling shoes.  She denies any new injuries or trauma.  She continues to stay active despite the discomfort.  Medications/Interventions Tried: Activity modification, OTC shoe inserts, custom orthotics, OTC NSAIDs  See HPI and/or previous note for associated ROS.  Objective: BP 118/75  Gen: NAD, well groomed, a/o x3, normal affect.  CV: Well-perfused. Warm.  Resp: Non-labored.  Neuro: Sensation intact throughout but with reports of slight decreased sensation in the bilateral forefoot, both dorsal and plantar. No gross coordination deficits.  Gait: Nonpathologic posture, unremarkable stride (forefoot striker) without signs of limp or balance issues. Ankle/Foot, Bilateral: TTP noted at the plantar aspect of the distal forefoot bilaterally without any specific areas of tenderness. No visible erythema, swelling, ecchymosis, or bony deformity.  Soft tissue nodule noted along the medial longitudinal arch of the right foot that was slightly tender to palpation.  Notable pes cavus deformity bilaterally. Transverse arch grossly collapsed bilaterally with obvious toe splaying; No evidence of tibiotalar deviation; Range of motion is full in all directions. Strength is 5/5 in all directions. Talar dome nontender; No plantar calcaneal tenderness; Bilateral tenderness at the distal metatarsals; Able to walk 4 steps.   Custom Orthotics: - Patient was fitted for a non-standard, cushioned, semi-rigid orthotic, with additional cushion at the forefoot. - Orthotic was heated and afterward the patient stood on the orthotic blank positioned on the  orthotic stand. - The patient was positioned in subtalar neutral position and 10 degrees of ankle dorsiflexion in a weight bearing stance. - After completion of molding, a stable base was applied to the orthotic blank. - The blank was ground to a stable position for weight bearing. - Size: 7 - Base: Blue EVA - Additional Posting and Padding: none other than what is stated above. - The patient ambulated in these, and they were very comfortable.   Assessment and plan:  Pes cavus, congenital Patient is here with bilateral rigid pes cavus deformity with subsequent forefoot pain.  Here for custom orthotics. -Orthotic with additional forefoot cushioning used today. -Patient jogged and ambulated in these and reported significant comfort. -Follow-up PRN with Dr. Darrick Penna.  I spent 30 minutes with this patient. Over 50% of visit was spent in counseling and coordination of care for problems with pes cavus (bilateral).   Kathee Delton, MD,MS [facility] Sports Medicine Fellow 12/12/2017 3:49 PM

## 2017-12-12 NOTE — Patient Instructions (Signed)
Access Code: [email or web address]  URL: [email or web address]  Date: 12/12/2017  Prepared by: Karie Mainland   Exercises  Doorway Pec Stretch at 90 Degrees Abduction - 3 reps - 1 sets - 30 hold - 1x daily - 7x weekly  Corner Pec Major Stretch - 3 reps - 1 sets - 30 hold - 1x daily - 7x weekly  Doorway Pec Stretch at 120 Degrees Abduction - 3 reps - 1 sets - 30 hold - 1x daily - 7x weekly

## 2017-12-12 NOTE — Therapy (Signed)
[place], [place], [address] Phone: [phone]   Fax:  [phone]  Physical Therapy Treatment  Patient Details  Name: Jacqueline Shaw MRN: [id number] Date of Birth: 06-02-1961 Referring Provider: Dr. Jarrett Ables   Encounter Date: 12/12/2017  PT End of Session - 12/12/17 1348    Visit Number  6    Date for PT Re-Evaluation  12/19/17    PT Start Time  1347    PT Stop Time  1430    PT Time Calculation (min)  43 min    Activity Tolerance  Patient tolerated treatment well    Behavior During Therapy  [facility] for tasks assessed/performed       Past Medical History:  Diagnosis Date  . Asthma   . Chronic left hip pain   . CLOSED FRACTURE OF METATARSAL BONE   . Concussion syndrome   . Depression   . Muscular fatigue   . Stroke [facility])     No past surgical history on file.  There were no vitals filed for this visit.  Subjective Assessment - 12/12/17 1349    Subjective  Was leaning on her countertop with both arms weekend and she had severe pain.  DId a bike ride last night and could barely get home.  She feels compressed in her back.  She has a nerve pain in her L arm when she moves., this is since she had theat initial pain when leaning on the countertop .     Currently in Pain?  No/denies    Pain Score  0-No pain    Pain Location  Shoulder    Pain Orientation  Left    Pain Descriptors / Indicators  Discomfort;Numbness    Pain Type  Surgical pain    Pain Onset  More than a month ago    Pain Frequency  Intermittent         [facility] PT Assessment - 12/12/17 0001      AROM   Left Shoulder Flexion  165 Degrees    Left Shoulder ABduction  160 Degrees    Left Shoulder Internal Rotation  -- FR to T2    Left Shoulder External Rotation  -- FR to T2       Strength   Left Shoulder Flexion  5/5    Left Shoulder ABduction  4+/5                   [facility] Adult PT Treatment/Exercise - 12/12/17  0001      Self-Care   Other Self-Care Comments   posture, lifting, exercie recommendation, Pilates       Shoulder Exercises: ROM/Strengthening   Plank  30 seconds;2 reps    Other ROM/Strengthening Exercises  10 lb overhead lift x 10, supine chest press 8 bs x 10 .  Rec avoiding fly due to long lever.        Shoulder Exercises: Stretch   Corner Stretch  3 reps;30 seconds             PT Education - 12/12/17 1431    Education provided  Yes    Education Details  DC, final recommendations for exercise, posture , joint position     Person(s) Educated  Patient    Methods  Explanation;Demonstration;Tactile cues;Verbal cues    Comprehension  Verbalized understanding;Returned demonstration          PT Long Term Goals - 12/12/17 1409      PT LONG TERM  GOAL #1   Title  Pt will be I with HEP for posture, stability in cervical spine, scapula, ROM    Status  Achieved      PT LONG TERM GOAL #2   Title  Pt will be able to lift 10 lbs overhead with no difficulty in preparation for weight lifting    Status  Achieved      PT LONG TERM GOAL #3   Title  Pt will be able to hold a plank for 30 sec without increasing shoulder pain     Status  Achieved      PT LONG TERM GOAL #4   Title  Pt will have no pain with home activities including yardwork, lifting using both UEs.     Status  Achieved      PT LONG TERM GOAL #5   Title  FOTO score will improve to 25% limited to demo improved functional activity.     Status  Achieved            Plan - 12/12/17 1439    Clinical Impression Statement  Patient met all LTGs.  We spoke at length about her next steps, options for exercise and how her body has adapted to her intense exercise routine.  I encouaged her to offer herself some balance and focus on alignment based exercises.  Biking aggravates her neck and back now and that is not how it has been for her.  Her L shoulder is doing well, strength is [facility] but she feels fascial restricitons  which she is happy to seek care for with a massage therapist.  She may do Pilates in the community as an adjunct.     PT Treatment/Interventions  Taping;Therapeutic activities;Therapeutic exercise;Patient/family education;Manual techniques;Dry needling;Cryotherapy;Moist Heat;Iontophoresis 51m/ml Dexamethasone;Neuromuscular re-education;ADLs/Self Care Home Management    PT Next Visit Plan  DC    PT Home Exercise Plan  prone, standing row, wall stretch/posture, wall press with 5 second hold, corner stretch     Consulted and Agree with Plan of Care  Patient       Patient will benefit from skilled therapeutic intervention in order to improve the following deficits and impairments:  Postural dysfunction, Pain, Impaired UE functional use, Increased fascial restricitons, Decreased strength, Decreased mobility  Visit Diagnosis: Abnormal posture  Muscle weakness (generalized)  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 Pes cavus, congenital 11/29/2017  . Neuropathic pain of foot 11/29/2017  . Metatarsalgia of both feet 11/29/2017  . Displaced fracture of shaft of left clavicle, initial encounter for closed fracture 02/15/2017  . Pain in joint, ankle and foot 12/08/2014  . Stress fracture of calcaneus 06/18/2014  . Plantar fasciitis of left foot 11/27/2013  . Abnormality of gait 10/28/2013  . Stress incontinence 10/28/2013  . Palpitations 10/09/2013  . Dyspnea 10/09/2013  . Depression 10/15/2012  . Chronic left hip pain 05/30/2012  . Concussion syndrome 02/14/2011  . Muscular fatigue 02/14/2011  . CLOSED FRACTURE OF METATARSAL BONE 01/12/2010    Naasia Weilbacher,Kaylean 12/12/2017, 2:43 PM  [facility] [address]Phone:  [phone]  Fax:  [phone] Name: JAkyia BorelliMRN: [id number] of Birth: [birth year]  PHYSICAL THERAPY DISCHARGE SUMMARY  Visits from Start of Care: 7  Current functional level related  to goals / functional outcomes: See above    Remaining deficits: Difficulty with long distance biking but more related to neck and back.    Education / Equipment: Posture, lifting, HEP, core, RICE  Plan: Patient agrees to discharge.  Patient goals were met. Patient is being discharged due to meeting the stated rehab goals.  ?????      Britanie Harshman, PT 12/12/17 2:43 PM Phone: [phone] Fax: [phone]

## 2019-05-19 ENCOUNTER — Other Ambulatory Visit: Payer: Self-pay

## 2019-05-19 ENCOUNTER — Ambulatory Visit (INDEPENDENT_AMBULATORY_CARE_PROVIDER_SITE_OTHER): Payer: BC Managed Care – PPO | Admitting: Family Medicine

## 2019-05-19 ENCOUNTER — Ambulatory Visit: Payer: Self-pay

## 2019-05-19 VITALS — BP 118/82 | Ht 67.0 in | Wt 143.0 lb

## 2019-05-19 DIAGNOSIS — S86811A Strain of other muscle(s) and tendon(s) at lower leg level, right leg, initial encounter: Secondary | ICD-10-CM | POA: Diagnosis not present

## 2019-05-19 DIAGNOSIS — M79661 Pain in right lower leg: Secondary | ICD-10-CM

## 2019-05-19 NOTE — Patient Instructions (Signed)
You have an overuse strain of your anterior tibialis. Ice the area 15 minutes at a time 3-4 times a day. Simple stretches - hold for 20-30 seconds, repeat 3 times a day. Expect this to take 7-10 days before you feel a lot better. Activities as tolerated as you recover though I'd limit the climbing on the bike over the next couple weeks. Aleve 1-2 tabs twice a day with food for pain and inflammation - consider taking for 7-10 days then as needed. Tylenol if needed in addition to this. Compression sleeve may be helpful. Follow up with me in 2-3 weeks for reevaluation.

## 2019-05-19 NOTE — Assessment & Plan Note (Signed)
Acute one day history of achy anterior lower leg pain worsened with dorsiflexion and edema overlying anterior tibialis muscle body consistent with tibialis anterior overuse strain in the setting of her increased physical regimen. Considered stress reaction given her previous history, however through her clinical presentation and no evidence on ultrasound suspect this is less likely.  Recommended ice and ibuprofen for the next 7-10 days and provided with home stretches.  May continue to cycle as tolerated.  Will have her follow-up in 2 to 3 weeks to assess progress.

## 2019-05-19 NOTE — Progress Notes (Signed)
  Jacqueline Shaw - 58 y.o. female MRN [id number]  Date of birth: [birth year]  SUBJECTIVE:   CC: Right lower leg pain   HPI: Ms. Jacqueline Shaw is a 58 year old female presenting for evaluation of the following:  Right lower leg pain: Acute onset yesterday afternoon in her anterior/lateral shin, described as achy with subsequent worsening since onset.  States prior to this she mowed her lawn and started noticing the pain walking around her house several hours afterwards.  She does note she has increased her cycling regimen over the past week, cycling 113 miles last week with a lot of uphill trails.  She has tried resting it since yesterday with Advil and ice, has not noticed much difference.  She is very concerned about this because she has never had this issue before.  Worse with bearing weight or activities specifically foot flexion, however can walk normally.  No preceding injury or trauma to the area.  Denies any associated swelling, bruising, weakness, numbness/tingling.  She is a very active cyclist, has not been running/swimming as often as she previously would.  ROS: No unexpected weight loss, fever, chills, swelling, instability, numbness/tingling, redness, otherwise see HPI   Past medical, surgical, family, and social history reviewed.  Medications reviewed.   PHYSICAL EXAM:  VS: BP:118/82  HR: bpm  TEMP: ( )  RESP:   HT:5\' 7"  (170.2 cm)   WT:143 lb (64.9 kg)  BMI:22.39 PHYSICAL EXAM: Gen: NAD, alert, cooperative with exam, well-appearing Resp: non-labored Skin: no rashes, normal turgor  Neuro: no gross deficits.  Psych:  alert and oriented  Right lower leg: - Inspection: No obvious deformity, erythema, swelling, or ecchymosis - Palpation: Tenderness to palpation along muscle body of tibialis anterior and intermittently on anterior distal tibia.  - Strength: Normal strength with dorsiflexion, plantarflexion, inversion, and eversion of foot.  Pain elicited within anterior tibialis with  resisted foot dorsiflexion and passive plantar flexion stretching. - ROM: Full ROM - Neuro/vasc: NV intact - Special Tests: Negative syndesmotic compression.  No instability.  Left lower leg: No deformity, skin changes, instability. FROM with 5/5 strength. No tenderness to palpation. NVI distally.  Limited ultrasound of right lower leg revealing mild edema overlying anterior tibialis muscle body.  No cortical defects noted of tibia.  Fibula unremarkable.  ASSESSMENT & PLAN:   Strain of right tibialis anterior muscle Acute one day history of achy anterior lower leg pain worsened with dorsiflexion and edema overlying anterior tibialis muscle body consistent with tibialis anterior overuse strain in the setting of her increased physical regimen. Considered stress reaction given her previous history, however through her clinical presentation and no evidence on ultrasound suspect this is less likely.  Recommended ice and ibuprofen for the next 7-10 days and provided with home stretches.  May continue to cycle as tolerated.  Will have her follow-up in 2 to 3 weeks to assess progress.   Follow-up in 2-3 weeks or sooner if needed  Patriciaann Clan, DO  Family Medicine PGY-2

## 2019-05-20 ENCOUNTER — Encounter: Payer: Self-pay | Admitting: Family Medicine

## 2019-06-04 ENCOUNTER — Other Ambulatory Visit: Payer: BC Managed Care – PPO | Admitting: Family Medicine

## 2019-06-09 ENCOUNTER — Other Ambulatory Visit: Payer: Self-pay

## 2019-06-09 ENCOUNTER — Ambulatory Visit (INDEPENDENT_AMBULATORY_CARE_PROVIDER_SITE_OTHER): Payer: BC Managed Care – PPO | Admitting: Family Medicine

## 2019-06-09 VITALS — BP 122/80

## 2019-06-09 DIAGNOSIS — G35 Multiple sclerosis: Secondary | ICD-10-CM | POA: Insufficient documentation

## 2019-06-09 NOTE — Assessment & Plan Note (Signed)
Patient with MRI read by Dr. Everette Rank as consistent with MS.  There were discussions about potential medication regimen for this which patient was told would inhibit some of her athletic endeavors so she declined.  She had otherwise been doing well until the last few months where she feels like "everything is been falling apart "and she has had significant decrease in her functional ability to exercise.  Advise following up with PCP and either Dr. Everette Rank or a second opinion neurologist if she chose, it is her belief she needs to have this discussion with the neurologist as her constellation of symptoms does not have any indication to being solely MSK.

## 2019-06-09 NOTE — Progress Notes (Signed)
    Subjective:  Jacqueline Shaw is a 58 y.o. female who presents to the [facility] today with a chief complaint of right tibialis anterior follow-up as well as multiple joint pain and decreasing muscular exercise tolerance.   HPI: Patient states that her right tibialis anterior is improving significantly.  In relation to her other problems she says this is essentially resolved, there is still some point tenderness to touch but she says her function there is now is good.  Most of her complaints focus around a constellation of diffuse symptoms, she states that her "bones ache", she says she has multiple joint pains from foot to ankle to right knee to mid thoracic back and hips bilaterally.  She denies any new specific injury or swelling and says that she just feels like she is "falling apart".  She also describes muscular symptoms where she says that she is "not firing" and has difficulty doing the activities that she used to do (significant endurance events including running and cycling) where now she says she cannot even really jog a mile or 2 without having to stop and walk painfully home.  Says she cannot do box jumps anymore - gets fatigue where she feels she cannot even step up onto box after a few jumps.  Also states that she feels her proprioception and balance is off, says she works so hard to focus on making sure that she is "stable " that she feels exhausted after even minimal athletic exercise compared to her prior activity level.  Of note she does have prior suspicion of MS for which she saw neurology remotely and had MRI in October 2015 showing hyperintense T2 signal in white matter of both hemispheres - concern for multiple sclerosis.  I was unable to view these images however.  Objective:  Physical Exam: BP 122/80   Gen: NAD, very anxious  Pulm: NWOB, no cough MSK: Inspection: Right tibialis anterior without visible edema Palpation: No erythema or palpable nodule but pain to palpation ROM:  Normal Strength: Normal Stability: n/a Special tests: n/a Neurovascular: No focal deficits identified *There was no laxity or pathological finding on right knee exam, walking and jogging gait was observed with no indication of significant proprioception deficits, no warmth or swelling to any joint indicating gout or concern for septic arthritis, no focal neuro deficits to exam Skin: warm, dry Neuro: grossly normal, moves all extremities Psych: Normal affect and thought content  No results found for this or any previous visit (from the past 72 hour(s)).   Assessment/Plan:  Multiple sclerosis ([place]) Diagnosis is probable and she's seen neurology remotely but concerned about possible treatment and how it would affect her activities.  She had otherwise been doing well until the last few months where she feels like "everything has been falling apart "and she has had significant decrease in her functional ability to exercise.  Orthopedic exam today is reassuring - no evidence of synovitis and her anterior tibialis strain has improved markedly.  We will refer her to neurology for evaluation and treatment - stressed importance of following through with this appointment.  Jacqueline Sires, DO FAMILY MEDICINE RESIDENT - PGY3 06/09/2019 4:49 PM

## 2019-06-09 NOTE — Patient Instructions (Signed)
It's very important that you see the neurologist to discuss your symptoms, your prior MRI, and potential treatment.

## 2019-06-10 ENCOUNTER — Encounter: Payer: Self-pay | Admitting: Family Medicine

## 2020-01-09 DIAGNOSIS — N8110 Cystocele, unspecified: Secondary | ICD-10-CM | POA: Insufficient documentation

## 2020-01-09 DIAGNOSIS — N816 Rectocele: Secondary | ICD-10-CM | POA: Insufficient documentation

## 2020-10-27 DIAGNOSIS — K219 Gastro-esophageal reflux disease without esophagitis: Secondary | ICD-10-CM | POA: Insufficient documentation

## 2020-11-24 DIAGNOSIS — K5902 Outlet dysfunction constipation: Secondary | ICD-10-CM | POA: Insufficient documentation

## 2020-11-30 ENCOUNTER — Encounter: Payer: Self-pay | Admitting: Family Medicine

## 2020-11-30 ENCOUNTER — Ambulatory Visit (HOSPITAL_BASED_OUTPATIENT_CLINIC_OR_DEPARTMENT_OTHER)
Admission: RE | Admit: 2020-11-30 | Discharge: 2020-11-30 | Disposition: A | Discharge status: Home / Self Care | Payer: BC Managed Care – PPO | Source: Ambulatory Visit | Attending: Family Medicine | Admitting: Family Medicine

## 2020-11-30 ENCOUNTER — Other Ambulatory Visit: Payer: Self-pay

## 2020-11-30 ENCOUNTER — Ambulatory Visit (INDEPENDENT_AMBULATORY_CARE_PROVIDER_SITE_OTHER): Payer: BC Managed Care – PPO | Admitting: Family Medicine

## 2020-11-30 VITALS — BP 130/82 | Ht 67.0 in | Wt 148.0 lb

## 2020-11-30 DIAGNOSIS — S161XXA Strain of muscle, fascia and tendon at neck level, initial encounter: Secondary | ICD-10-CM | POA: Diagnosis present

## 2020-11-30 MED ORDER — KETOROLAC TROMETHAMINE 30 MG/ML IJ SOLN
30.0000 mg | Freq: Once | INTRAMUSCULAR | Status: AC
  Administered 2020-11-30: 16:00:00 30 mg via INTRAMUSCULAR

## 2020-11-30 MED ORDER — METHYLPREDNISOLONE ACETATE 40 MG/ML IJ SUSP
40.0000 mg | Freq: Once | INTRAMUSCULAR | Status: AC
Start: 2020-11-30 — End: 2020-11-30
  Administered 2020-11-30: 40 mg via INTRA_ARTICULAR

## 2020-11-30 MED ORDER — HYDROCODONE-ACETAMINOPHEN 5-325 MG PO TABS: 1.0000 | ORAL_TABLET | Freq: Three times a day (TID) | ORAL | 0 refills | Status: AC | PRN

## 2020-11-30 NOTE — Patient Instructions (Signed)
Nice to meet you Please try heat  Please try the soft collar  Please try the pain medicine as needed  Please try the exercises   Please send me a message in MyChart with any questions or updates.  Please see me back in 2 weeks.   --Dr. Jordan Likes

## 2020-11-30 NOTE — Progress Notes (Signed)
Jacqueline Shaw - 60 y.o. female MRN [id number]  Date of birth: August 24, 1960  SUBJECTIVE:  Including CC & ROS.  No chief complaint on file.   Jacqueline Shaw is a 60 y.o. female that is presenting with acute left-sided neck pain.  The pain has been ongoing over the past few hours.  The pain is severe in nature.  It is starting at the base of her skull and radiates down her neck.  Denies any injury or inciting event.   Independent review of the cervical spine x-ray from today shows no fracture significant changes.  I  Review of Systems See HPI   HISTORY: Past Medical, Surgical, Social, and Family History Reviewed & Updated per EMR.   Pertinent Historical Findings include:  Past Medical History:  Diagnosis Date   Asthma    Chronic left hip pain    CLOSED FRACTURE OF METATARSAL BONE    Concussion syndrome    Depression    Muscular fatigue    Stroke [facility])     History reviewed. No pertinent surgical history.  History reviewed. No pertinent family history.  Social History   Socioeconomic History   Marital status: Legally Separated    Spouse name: Not on file   Number of children: Not on file   Years of education: Not on file   Highest education level: Not on file  Occupational History   Not on file  Tobacco Use   Smoking status: Former    Pack years: 0.00   Smokeless tobacco: Never  Substance and Sexual Activity   Alcohol use: Yes    Comment: ocassional   Drug use: No   Sexual activity: Not on file  Other Topics Concern   Not on file  Social History Narrative   Not on file   Social Determinants of Health   Financial Resource Strain: Not on file  Food Insecurity: Not on file  Transportation Needs: Not on file  Physical Activity: Not on file  Stress: Not on file  Social Connections: Not on file  Intimate Partner Violence: Not on file     PHYSICAL EXAM:  VS: BP 130/82 (BP Location: Left Arm, Patient Position: Sitting, Cuff Size: Normal)   Ht 5\' 7"  (1.702 m)    Wt 148 lb (67.1 kg)   BMI 23.18 kg/m  Physical Exam Gen: NAD, alert, cooperative with exam, well-appearing MSK:  Neck: Normal range of motion. Normal strength resistance. Normal strength resistance with upper extremities. No palpable defect. Neurovascular intact   Aspiration/Injection Procedure Note Jacqueline Shaw 01/22/1961  Procedure: Injection Indications: Trigger point pain and neck pain  Procedure Details Consent: Risks of procedure as well as the alternatives and risks of each were explained to the (patient/caregiver).  Consent for procedure obtained. Time Out: Verified patient identification, verified procedure, site/side was marked, verified correct patient position, special equipment/implants available, medications/allergies/relevent history reviewed, required imaging and test results available.  Performed.  The area was cleaned with iodine and alcohol swabs.    The trigger points of the semispinalis and splenius capitis was injected using 1 cc's of 40 mg Depo-Medrol and 4 cc's of 0.25% bupivacaine with a 25 1" needle.  Ultrasound was used. I   A sterile dressing was applied.  Patient did tolerate procedure well.    ASSESSMENT & PLAN:   Cervical strain Symptoms are consistent with nerve given the severity of the pain and spasm around the neck. -Counseled on home exercise therapy and supportive care. -Cervical soft collar. -IM Toradol. -Trigger point  injections. -Counseled on gabapentin. -X-ray. -Could consider physical therapy.

## 2020-12-01 ENCOUNTER — Emergency Department (HOSPITAL_BASED_OUTPATIENT_CLINIC_OR_DEPARTMENT_OTHER): Payer: BC Managed Care – PPO

## 2020-12-01 ENCOUNTER — Emergency Department (HOSPITAL_BASED_OUTPATIENT_CLINIC_OR_DEPARTMENT_OTHER)
Admission: EM | Admit: 2020-12-01 | Discharge: 2020-12-01 | Disposition: A | Discharge status: Home / Self Care | Payer: BC Managed Care – PPO | Attending: Emergency Medicine | Admitting: Emergency Medicine

## 2020-12-01 ENCOUNTER — Encounter (HOSPITAL_BASED_OUTPATIENT_CLINIC_OR_DEPARTMENT_OTHER): Payer: Self-pay

## 2020-12-01 ENCOUNTER — Other Ambulatory Visit (HOSPITAL_BASED_OUTPATIENT_CLINIC_OR_DEPARTMENT_OTHER): Payer: Self-pay

## 2020-12-01 ENCOUNTER — Other Ambulatory Visit: Payer: Self-pay

## 2020-12-01 DIAGNOSIS — R519 Headache, unspecified: Secondary | ICD-10-CM | POA: Insufficient documentation

## 2020-12-01 DIAGNOSIS — X58XXXA Exposure to other specified factors, initial encounter: Secondary | ICD-10-CM | POA: Insufficient documentation

## 2020-12-01 DIAGNOSIS — S199XXA Unspecified injury of neck, initial encounter: Secondary | ICD-10-CM | POA: Diagnosis present

## 2020-12-01 DIAGNOSIS — S161XXA Strain of muscle, fascia and tendon at neck level, initial encounter: Secondary | ICD-10-CM

## 2020-12-01 DIAGNOSIS — J45909 Unspecified asthma, uncomplicated: Secondary | ICD-10-CM | POA: Diagnosis not present

## 2020-12-01 DIAGNOSIS — Z87891 Personal history of nicotine dependence: Secondary | ICD-10-CM | POA: Insufficient documentation

## 2020-12-01 MED ORDER — DIAZEPAM 2 MG PO TABS
2.0000 mg | ORAL_TABLET | Freq: Once | ORAL | Status: AC
  Administered 2020-12-01: 2 mg via ORAL
  Filled 2020-12-01: qty 1

## 2020-12-01 MED ORDER — METHYLPREDNISOLONE 4 MG PO TBPK
ORAL_TABLET | ORAL | 0 refills | Status: DC
  Filled 2020-12-01: qty 21, 6d supply, fill #0

## 2020-12-01 NOTE — ED Provider Notes (Signed)
[facility] EMERGENCY DEPARTMENT Provider Note   CSN: [id number] Arrival date & time: 12/01/20  1221     History Chief Complaint  Patient presents with   Neck Pain    Jacqueline Shaw is a 60 y.o. female.  The history is provided by the patient.  Neck Pain Pain location:  L side and occipital region Quality:  Aching Pain radiates to:  Does not radiate Pain severity:  Mild Pain is:  Same all the time Onset quality:  Gradual Duration:  5 days Timing:  Intermittent Progression:  Waxing and waning Chronicity:  New Relieved by:  Nothing Worsened by:  Nothing Associated symptoms: no bladder incontinence, no bowel incontinence, no chest pain, no fever, no headaches, no leg pain, no numbness, no paresis, no photophobia, no syncope, no tingling, no visual change, no weakness and no weight loss       Past Medical History:  Diagnosis Date   Asthma    Chronic left hip pain    CLOSED FRACTURE OF METATARSAL BONE    Concussion syndrome    Depression    Muscular fatigue    Stroke [facility])     Patient Active Problem List   Diagnosis Date Noted   Cervical strain 12/01/2020   Multiple sclerosis (HCC) 06/09/2019   Strain of right tibialis anterior muscle 05/19/2019   Pes cavus, congenital 11/29/2017   Neuropathic pain of foot 11/29/2017   Metatarsalgia of both feet 11/29/2017   Displaced fracture of shaft of left clavicle, initial encounter for closed fracture 02/15/2017   Pain in joint, ankle and foot 12/08/2014   Stress fracture of calcaneus 06/18/2014   Plantar fasciitis of left foot 11/27/2013   Abnormality of gait 10/28/2013   Stress incontinence 10/28/2013   Palpitations 10/09/2013   Dyspnea 10/09/2013   Depression 10/15/2012   Chronic left hip pain 05/30/2012   Concussion syndrome 02/14/2011   Muscular fatigue 02/14/2011   CLOSED FRACTURE OF METATARSAL BONE 01/12/2010    History reviewed. No pertinent surgical history.   OB History   No obstetric history  on file.     No family history on file.  Social History   Tobacco Use   Smoking status: Former    Pack years: 0.00   Smokeless tobacco: Never  Substance Use Topics   Alcohol use: Not Currently   Drug use: No    Home Medications Prior to Admission medications   Medication Sig Start Date End Date Taking? Authorizing Provider  methylPREDNISolone (MEDROL DOSEPAK) 4 MG TBPK tablet Follow package insert 12/01/20  Yes Haylin Camilli, DO  Cholecalciferol (VITAMIN D3) 2400 UNIT/ML LIQD Take by mouth.    [provider]  EPINEPHrine 0.3 mg/0.3 mL IJ SOAJ injection INJECT 0.3 ML (0.3MG ) INTO THE MUSCLE 02/29/16   [provider]  gabapentin (NEURONTIN) 100 MG capsule TAKE ONE CAPSULE BY MOUTH AT NIGHT TIME AND INCREASE TO 3 CAPSULES FOR INSOMNIA/HOT FLASHES 04/04/16   [provider]  HYDROcodone-acetaminophen (NORCO/VICODIN) 5-325 MG tablet Take 1 tablet by mouth every 8 (eight) hours as needed. 11/30/20   Myra Rude, MD  Multiple Vitamin (MULTIVITAMIN) tablet Take 1 tablet by mouth daily.      [provider]  PROAIR HFA 108 (90 Base) MCG/ACT inhaler INHALE 2 PUFFS EVERY FOUR (4) HOURS AS NEEDED FOR WHEEZING. 03/16/16   [provider]  progesterone (PROMETRIUM) 200 MG capsule Take 200 mg by mouth at bedtime. 10/05/17   [provider]  VYVANSE 30 MG capsule  05/23/19  [provider]    Allergies    Bee venom and Shellfish allergy  Review of Systems   Review of Systems  Constitutional:  Negative for chills, fever and weight loss.  HENT:  Negative for ear pain and sore throat.   Eyes:  Negative for photophobia, pain and visual disturbance.  Respiratory:  Negative for cough and shortness of breath.   Cardiovascular:  Negative for chest pain, palpitations and syncope.  Gastrointestinal:  Negative for abdominal pain, bowel incontinence and vomiting.  Genitourinary:  Negative for bladder incontinence, dysuria and hematuria.   Musculoskeletal:  Positive for neck pain and neck stiffness. Negative for arthralgias, back pain, gait problem, joint swelling and myalgias.  Skin:  Negative for color change and rash.  Neurological:  Negative for dizziness, tingling, tremors, seizures, syncope, facial asymmetry, speech difficulty, weakness, light-headedness, numbness and headaches.  All other systems reviewed and are negative.  Physical Exam Updated Vital Signs  ED Triage Vitals  Enc Vitals Group     BP 12/01/20 1234 (!) 152/84     Pulse Rate 12/01/20 1234 67     Resp 12/01/20 1234 20     Temp 12/01/20 1234 97.8 F (36.6 C)     Temp Source 12/01/20 1234 Oral     SpO2 12/01/20 1234 100 %     Weight 12/01/20 1234 147 lb (66.7 kg)     Height 12/01/20 1234 5\' 7"  (1.702 m)     Head Circumference --      Peak Flow --      Pain Score 12/01/20 1229 10     Pain Loc --      Pain Edu? --      Excl. in GC? --     Physical Exam Vitals and nursing note reviewed.  Constitutional:      General: She is not in acute distress.    Appearance: Normal appearance. She is well-developed. She is not ill-appearing.  HENT:     Head: Normocephalic and atraumatic.     Nose: Nose normal.     Mouth/Throat:     Mouth: Mucous membranes are moist.  Eyes:     Extraocular Movements: Extraocular movements intact.     Conjunctiva/sclera: Conjunctivae normal.     Pupils: Pupils are equal, round, and reactive to light.  Neck:     Comments: Tenderness to left-sided paraspinal cervical muscles up into the occiput but overall good range of motion of the neck Cardiovascular:     Rate and Rhythm: Normal rate and regular rhythm.     Heart sounds: No murmur heard. Pulmonary:     Effort: Pulmonary effort is normal. No respiratory distress.     Breath sounds: Normal breath sounds.  Abdominal:     Palpations: Abdomen is soft.     Tenderness: There is no abdominal tenderness.  Musculoskeletal:        General: No tenderness. Normal range of  motion.     Cervical back: Neck supple.  Skin:    General: Skin is warm and dry.     Capillary Refill: Capillary refill takes less than 2 seconds.  Neurological:     General: No focal deficit present.     Mental Status: She is alert and oriented to person, place, and time.     Cranial Nerves: No cranial nerve deficit.     Sensory: No sensory deficit.     Motor: No weakness.     Coordination: Coordination normal.     Comments: 5+ out  of 5 strength throughout, normal sensation, no drift, normal finger-nose-finger, normal speech    ED Results / Procedures / Treatments   Labs (all labs ordered are listed, but only abnormal results are displayed) Labs Reviewed - No data to display  EKG None  Radiology DG Cervical Spine 2 or 3 views  Result Date: 12/01/2020 CLINICAL DATA:  Neck pain.  No known injury. EXAM: CERVICAL SPINE - 2-3 VIEW COMPARISON:  MRI cervical spine 02/26/2020. FINDINGS: Reversal of the normal cervical lordosis, similar to prior MRI. No substantial sagittal subluxation. Prevertebral soft tissues are within normal limits. Moderate degenerative disease at C5-C6. IMPRESSION: 1. No radiographic evidence of acute fracture or traumatic malalignment. Please see forthcoming CT of the cervical spine for more sensitive evaluation. 2. Moderate degenerative disease at C5-C6. Electronically Signed   By: Feliberto Harts MD   On: 12/01/2020 13:24   CT Head Wo Contrast  Result Date: 12/01/2020 CLINICAL DATA:  Head pain. EXAM: CT HEAD WITHOUT CONTRAST TECHNIQUE: Contiguous axial images were obtained from the base of the skull through the vertex without intravenous contrast. COMPARISON:  MRI 02/26/2020. FINDINGS: Brain: No evidence of acute large vascular territory infarction, hemorrhage, hydrocephalus, extra-axial collection or mass lesion/mass effect. Patchy white matter hypodensities, most likely related to chronic microvascular ischemia and better characterized on prior MRI. Vascular: No  hyperdense vessel identified. Skull: No acute fracture. Sinuses/Orbits: Visualized sinuses are clear. Unremarkable visualized orbits. Other: No mastoid effusions. IMPRESSION: 1. No evidence of acute intracranial abnormality. 2. Patchy white matter hypodensities, most likely related to chronic microvascular ischemia and better characterized on prior MRI. Electronically Signed   By: Feliberto Harts MD   On: 12/01/2020 13:26   CT Cervical Spine Wo Contrast  Result Date: 12/01/2020 CLINICAL DATA:  Pain. EXAM: CT CERVICAL SPINE WITHOUT CONTRAST TECHNIQUE: Multidetector CT imaging of the cervical spine was performed without intravenous contrast. Multiplanar CT image reconstructions were also generated. COMPARISON:  MRI cervical spine 02/26/2020. FINDINGS: Alignment: Reversal of the normal cervical lordosis, similar to prior MRI. Skull base and vertebrae: No evidence of acute fracture. Vertebral body heights are maintained. Soft tissues and spinal canal: No prevertebral fluid or swelling. No visible canal hematoma. Disc levels: Moderate degenerative disc height loss at C5-C6 with endplate spurring. Upper chest: Biapical pleuroparenchymal scarring and suspected paraseptal emphysematous change. IMPRESSION: 1. No evidence of acute fracture or traumatic malalignment. 2. Moderate degenerative disease at C5-C6. Electronically Signed   By: Feliberto Harts MD   On: 12/01/2020 13:31    Procedures Procedures   Medications Ordered in ED Medications  diazepam (VALIUM) tablet 2 mg (2 mg Oral Given 12/01/20 1258)    ED Course  I have reviewed the triage vital signs and the nursing notes.  Pertinent labs & imaging results that were available during my care of the patient were reviewed by me and considered in my medical decision making (see chart for details).    MDM Rules/Calculators/A&P                          Cythnia Syslo is here with neck pain.  Had trigger point injections yesterday and prescribed  narcotics and gabapentin.  Neck pain after a long bike ride several days ago.  She is not having any radicular symptoms.  Normal neurological exam.  She is concerned that there is something else going on and overall shared decision was made to get a head and neck CT.  Overall suspect that she does  have a muscle spasm.  She would like steroids and will prescribe her Medrol Dosepak.  We will give her dose of Valium here after long discussion about pain management.  Head and neck CT are normal.  Overall suspect muscular process.  Will prescribe Medrol Dosepak and have her follow-up with primary care doctor.  Suspect that she may need physical therapy may be an MRI but does not appear to be having any radicular symptoms at this time.  This chart was dictated using voice recognition software.  Despite best efforts to proofread,  errors can occur which can change the documentation meaning.   Final Clinical Impression(s) / ED Diagnoses Final diagnoses:  Acute strain of neck muscle, initial encounter    Rx / DC Orders ED Discharge Orders          Ordered    methylPREDNISolone (MEDROL DOSEPAK) 4 MG TBPK tablet        12/01/20 1327             Pietrina Jagodzinski, DO 12/01/20 1337

## 2020-12-01 NOTE — Assessment & Plan Note (Signed)
Symptoms are consistent with nerve given the severity of the pain and spasm around the neck. -Counseled on home exercise therapy and supportive care. -Cervical soft collar. -IM Toradol. -Trigger point injections. -Counseled on gabapentin. -X-ray. -Could consider physical therapy.

## 2020-12-01 NOTE — Discharge Instructions (Addendum)
Overall no acute findings on head and neck CT.  Follow-up with your primary care doctor/sports medicine doctor.  Take Medrol Dosepak as prescribed.

## 2020-12-01 NOTE — ED Triage Notes (Signed)
Pt c/o left side neck pain and base of skull pain x 4 days-pain started after riding bike 55 miles-states she was seen by ortho yesterday-states no better with injections in the office and rx meds-pt NAD/crying

## 2020-12-14 ENCOUNTER — Ambulatory Visit: Payer: BC Managed Care – PPO | Admitting: Family Medicine

## 2020-12-14 NOTE — Progress Notes (Deleted)
  Jacqueline Shaw - 60 y.o. female MRN [id number]  Date of birth: [birth year]  SUBJECTIVE:  Including CC & ROS.  No chief complaint on file.   Jacqueline Shaw is a 60 y.o. female that is  ***.  ***   Review of Systems See HPI   HISTORY: Past Medical, Surgical, Social, and Family History Reviewed & Updated per EMR.   Pertinent Historical Findings include:  Past Medical History:  Diagnosis Date   Asthma    Chronic left hip pain    CLOSED FRACTURE OF METATARSAL BONE    Concussion syndrome    Depression    Muscular fatigue    Stroke (HCC)     No past surgical history on file.  No family history on file.  Social History   Socioeconomic History   Marital status: Legally Separated    Spouse name: Not on file   Number of children: Not on file   Years of education: Not on file   Highest education level: Not on file  Occupational History   Not on file  Tobacco Use   Smoking status: Former    Pack years: 0.00   Smokeless tobacco: Never  Substance and Sexual Activity   Alcohol use: Not Currently   Drug use: No   Sexual activity: Not on file  Other Topics Concern   Not on file  Social History Narrative   Not on file   Social Determinants of Health   Financial Resource Strain: Not on file  Food Insecurity: Not on file  Transportation Needs: Not on file  Physical Activity: Not on file  Stress: Not on file  Social Connections: Not on file  Intimate Partner Violence: Not on file     PHYSICAL EXAM:  VS: LMP 08/30/2013  Physical Exam Gen: NAD, alert, cooperative with exam, well-appearing MSK:  ***      ASSESSMENT & PLAN:   No problem-specific Assessment & Plan notes found for this encounter.

## 2021-01-07 DIAGNOSIS — M79642 Pain in left hand: Secondary | ICD-10-CM | POA: Insufficient documentation

## 2021-01-07 DIAGNOSIS — M25522 Pain in left elbow: Secondary | ICD-10-CM | POA: Insufficient documentation

## 2021-02-12 DIAGNOSIS — M13849 Other specified arthritis, unspecified hand: Secondary | ICD-10-CM | POA: Insufficient documentation

## 2021-03-30 DIAGNOSIS — N814 Uterovaginal prolapse, unspecified: Secondary | ICD-10-CM | POA: Insufficient documentation

## 2022-10-02 ENCOUNTER — Encounter: Payer: Self-pay | Admitting: *Deleted

## 2022-11-06 ENCOUNTER — Other Ambulatory Visit: Payer: Self-pay

## 2023-07-05 ENCOUNTER — Other Ambulatory Visit: Payer: Self-pay | Admitting: Family Medicine

## 2023-07-05 NOTE — Telephone Encounter (Signed)
Copied from CRM [id number]. Topic: Clinical - Medication Refill >> Jul 05, 2023 12:18 PM Shelah Lewandowsky wrote: Most Recent Primary Care Visit:   Medication: Adderall 20 mg name brand not generic  Has the patient contacted their pharmacy? Yes (Agent: If no, request that the patient contact the pharmacy for the refill. If patient does not wish to contact the pharmacy document the reason why and proceed with request.) (Agent: If yes, when and what did the pharmacy advise?)  Is this the correct pharmacy for this prescription? Yes If no, delete pharmacy and type the correct one.  This is the patient's preferred pharmacy:  [id number] - [place], [place] - [address] AT [address] OF [facility] [address] [place] [address] Phone: [phone] Fax: [phone]     Has the prescription been filled recently? Yes  Is the patient out of the medication? Yes  Has the patient been seen for an appointment in the last year OR does the patient have an upcoming appointment? Yes  Can we respond through MyChart? No  Agent: Please be advised that Rx refills may take up to 3 business days. We ask that you follow-up with your pharmacy.

## 2023-07-06 MED ORDER — AMPHETAMINE-DEXTROAMPHETAMINE 20 MG PO TABS: 20.0000 mg | ORAL_TABLET | Freq: Every day | ORAL | 0 refills | Status: DC

## 2023-07-19 DIAGNOSIS — M7502 Adhesive capsulitis of left shoulder: Secondary | ICD-10-CM | POA: Insufficient documentation

## 2023-07-19 DIAGNOSIS — M75102 Unspecified rotator cuff tear or rupture of left shoulder, not specified as traumatic: Secondary | ICD-10-CM | POA: Insufficient documentation

## 2023-07-19 DIAGNOSIS — S42009K Fracture of unspecified part of unspecified clavicle, subsequent encounter for fracture with nonunion: Secondary | ICD-10-CM | POA: Insufficient documentation

## 2023-07-31 DIAGNOSIS — N951 Menopausal and female climacteric states: Secondary | ICD-10-CM | POA: Insufficient documentation

## 2023-08-12 ENCOUNTER — Other Ambulatory Visit: Payer: Self-pay | Admitting: Family Medicine

## 2023-08-13 MED ORDER — AMPHETAMINE-DEXTROAMPHETAMINE 20 MG PO TABS: 20.0000 mg | ORAL_TABLET | Freq: Every day | ORAL | 0 refills | Status: DC

## 2023-09-11 ENCOUNTER — Other Ambulatory Visit: Payer: Self-pay | Admitting: Family Medicine

## 2023-09-14 ENCOUNTER — Telehealth: Payer: Self-pay

## 2023-09-14 MED ORDER — AMPHETAMINE-DEXTROAMPHETAMINE 20 MG PO TABS: 20.0000 mg | ORAL_TABLET | Freq: Every day | ORAL | 0 refills | Status: DC

## 2023-09-14 NOTE — Telephone Encounter (Signed)
 Copied from CRM [id number]. Topic: Appointments - Appointment Scheduling >> Sep 14, 2023  9:36 AM Emylou G wrote: Pls call patient.. she has been with Riley Nearing for a long time but not here..says she needs script refill before her appt? Is this possible?

## 2023-10-10 ENCOUNTER — Other Ambulatory Visit: Payer: Self-pay | Admitting: Family Medicine

## 2023-10-10 MED ORDER — AMPHETAMINE-DEXTROAMPHETAMINE 20 MG PO TABS: 20.0000 mg | ORAL_TABLET | Freq: Every day | ORAL | 0 refills | Status: DC

## 2023-10-16 ENCOUNTER — Other Ambulatory Visit: Payer: Self-pay | Admitting: Family Medicine

## 2023-10-16 MED ORDER — AMPHETAMINE-DEXTROAMPHETAMINE 20 MG PO TABS: 20.0000 mg | ORAL_TABLET | Freq: Every day | ORAL | 0 refills | Status: DC

## 2023-10-16 NOTE — Telephone Encounter (Signed)
 Requesting refill:  Last OV: 05/21/23 Next OV: 01/02/24  Please approve or deny. Thank you

## 2023-11-21 ENCOUNTER — Ambulatory Visit (INDEPENDENT_AMBULATORY_CARE_PROVIDER_SITE_OTHER): Payer: Self-pay | Admitting: Family Medicine

## 2023-11-21 VITALS — BP 124/82 | HR 77 | Temp 98.9°F | Ht 67.0 in | Wt 155.0 lb

## 2023-11-21 DIAGNOSIS — E785 Hyperlipidemia, unspecified: Secondary | ICD-10-CM

## 2023-11-21 DIAGNOSIS — J301 Allergic rhinitis due to pollen: Secondary | ICD-10-CM

## 2023-11-21 DIAGNOSIS — Z23 Encounter for immunization: Secondary | ICD-10-CM

## 2023-11-21 DIAGNOSIS — J45991 Cough variant asthma: Secondary | ICD-10-CM

## 2023-11-21 DIAGNOSIS — F900 Attention-deficit hyperactivity disorder, predominantly inattentive type: Secondary | ICD-10-CM

## 2023-11-21 MED ORDER — AMPHETAMINE-DEXTROAMPHETAMINE 20 MG PO TABS: 20.0000 mg | ORAL_TABLET | Freq: Every day | ORAL | 0 refills | Status: DC

## 2023-11-21 MED ORDER — LEVOCETIRIZINE DIHYDROCHLORIDE 5 MG PO TABS: 5.0000 mg | ORAL_TABLET | Freq: Every evening | ORAL | 2 refills | Status: AC

## 2023-11-21 MED ORDER — PROAIR HFA 108 (90 BASE) MCG/ACT IN AERS: 2.0000 | INHALATION_SPRAY | RESPIRATORY_TRACT | 2 refills | Status: AC | PRN

## 2023-11-21 NOTE — Progress Notes (Unsigned)
 Patient Office Visit  Assessment & Plan:  Attention deficit hyperactivity disorder, inattentive type -     Amphetamine -Dextroamphetamine ; Take 1 tablet (20 mg total) by mouth daily.  Dispense: 90 tablet; Refill: 0  Cough variant asthma -     ProAir HFA; Inhale 2 puffs into the lungs every 4 (four) hours as needed for wheezing or shortness of breath.  Dispense: 1 each; Refill: 2  Need for Tdap vaccination  Hyperlipidemia, unspecified hyperlipidemia type  Allergic rhinitis due to pollen, unspecified seasonality -     Levocetirizine Dihydrochloride; Take 1 tablet (5 mg total) by mouth every evening.  Dispense: 30 tablet; Refill: 2   Assessment and Plan    Attention Deficit Hyperactivity Disorder (ADHD) Adderall effective without significant side effects. Discussed 90-day supply for international trip. - Prescribe 90-day supply of Adderall with monthly dispensing.  Asthma Breathing issues during cardio activities. Albuterol used as needed. Xyzal improved cough. Consider further allergy management if symptoms persist. - Continue albuterol inhaler as needed. - Consider allergy testing or allergist referral if symptoms persist. - Continue Xyzal for cough management.  Hyperlipidemia Cholesterol elevated. Discussed coronary calcium score to assess cardiovascular risk and guide statin therapy. - Order cholesterol blood test. - Consider coronary calcium score if cholesterol remains elevated.  General Health Maintenance Overdue for tetanus booster. Not up to date with mammograms or colon cancer screenings. Discussed Cologuard test. - Administer tetanus booster before travel. - Encourage consideration of Cologuard for colon cancer screening.  Follow-up Planning to travel and return for follow-up in four months if in the [place] . - Schedule follow-up appointment in four months if in the [place] .      RTC 4 mos or sooner if nec.   No follow-ups on file.   Subjective:     Patient ID:  Jacqueline Shaw, female    DOB: [birth year]  Age: 63 y.o. MRN: [id number]  Chief Complaint  Patient presents with   Medical Management of Chronic Issues   Establish Care    HPI Discussed the use of AI scribe software for clinical note transcription with the patient, who gave verbal consent to proceed.  History of Present Illness        Jacqueline Shaw is a 63 year old female who presents for medication management and travel planning. Patient is also here to establish primary care.   She is currently on Adderall for ADHD, which she finds effective without significant side effects. She has switched to a generic version due to cost considerations and has not noticed a difference in efficacy. She is planning to travel out of the country for six months and is concerned about managing her medication supply during this period, as she does not have insurance and is unsure about the legalities of obtaining a six-month supply.  She has a history of asthma since age [age] and reports difficulty with cardio activities such as running and cycling due to breathing issues. She has used her albuterol inhaler twice recently but feels she does not have a complete understanding of its use. She is currently participating in HIIT classes and lifting weights four days a week. She experiences significant frustration with her cardio capacity and has a history of wheezing. She also reports a persistent cough that sometimes leads to incontinence. She has started using Xyzal, which has improved her cough. She suspects postnasal drip as a contributing factor, as she often wakes up with a stuffy nose. She has a history of allergies and has previously undergone allergy  testing and shots.  She has been experiencing hamstring pain, which she attributes to cycling. She recently resumed cycling after a long break and has been lifting weights for 10-12 weeks. She completed a cycling trip in the fall, covering 440 miles and 36,000 feet of  climbing, which she found exhausting.  She is concerned about her cholesterol levels, which were last recorded as elevated in December. She is postmenopausal and has noticed weight gain, which she attributes to reduced cardio activity. She is mindful of her diet, consuming fresh fruits, vegetables, and lean proteins, but occasionally indulges in high-fat foods.  She experiences coughing fits that disrupt her sleep and has been using over-the-counter medications to manage her symptoms. She has a history of using Nicorette gum but is not currently using it. Physical Exam Results LABS Cholesterol: 274 mg/dL (40/98/1191) LDL: 478 mg/dL (29/56/2130) Assessment & Plan Attention Deficit Hyperactivity Disorder (ADHD) Adderall effective without significant side effects. Discussed 90-day supply for international trip. - Prescribe 90-day supply of Adderall with monthly dispensing.  Asthma Breathing issues during cardio activities. Albuterol used as needed. Xyzal improved cough. Consider further allergy management if symptoms persist. Patient declines cardiology consult at this time. - Continue albuterol inhaler as needed. - Consider allergy testing or allergist referral if symptoms persist. - Continue Xyzal for cough/postnasal drip and allergy symptoms  Hyperlipidemia Cholesterol elevated. Discussed coronary calcium score to assess cardiovascular risk and guide statin therapy. Patient deferred doing labs today.  - Order cholesterol blood test. - Consider coronary calcium score if cholesterol remains elevated.  General Health Maintenance Overdue for tetanus booster. Not up to date with mammograms or colon cancer screenings. Discussed Cologuard test. - Administer tetanus booster before travel. - Encourage consideration of Cologuard for colon cancer screening.  Follow-up Planning to travel and return for follow-up in four months if in the [place] . - Schedule follow-up appointment in four months if in  the [place] .     The ASCVD Risk score (Arnett DK, et al., 2019) failed to calculate for the following reasons:   Risk score cannot be calculated because patient has a medical history suggesting prior/existing ASCVD  Past Medical History:  Diagnosis Date   Asthma    Chronic left hip pain    CLOSED FRACTURE OF METATARSAL BONE    Concussion syndrome    Depression    Muscular fatigue    Stroke [facility])    Past Surgical History:  Procedure Laterality Date   CLAVICLE SURGERY Bilateral    Social History   Tobacco Use   Smoking status: Former   Smokeless tobacco: Never  Substance Use Topics   Alcohol use: Not Currently   Drug use: No   Family History  Problem Relation Age of Onset   Glaucoma Mother    Macular degeneration Mother    Hypertension Mother    Rheum arthritis Mother    Lymphoma Father    Melanoma Father    Alcohol abuse Son    Anxiety disorder Son    Allergies  Allergen Reactions   Bee Venom Anaphylaxis and Other (See Comments)    "Passed out" "Passed out"   [employer] Other (See Comments)    "Passed out"   Shellfish Allergy Anaphylaxis    ROS    Objective:    BP 124/82   Pulse 77   Temp 98.9 F (37.2 C)   Ht 5\' 7"  (1.702 m)   Wt 155 lb (70.3 kg)   LMP 08/30/2013   SpO2 98%   BMI  24.28 kg/m  BP Readings from Last 3 Encounters:  11/21/23 124/82  12/01/20 (!) 152/84  11/30/20 130/82   Wt Readings from Last 3 Encounters:  11/21/23 155 lb (70.3 kg)  12/01/20 147 lb (66.7 kg)  11/30/20 148 lb (67.1 kg)    Physical Exam Vitals and nursing note reviewed.  Constitutional:      Appearance: Normal appearance.  HENT:     Head: Normocephalic.     Right Ear: Tympanic membrane, ear canal and external ear normal.     Left Ear: Tympanic membrane, ear canal and external ear normal.  Eyes:     Extraocular Movements: Extraocular movements intact.     Conjunctiva/sclera: Conjunctivae normal.     Pupils: Pupils are equal, round, and reactive to light.   Cardiovascular:     Rate and Rhythm: Normal rate and regular rhythm.     Heart sounds: Normal heart sounds.  Pulmonary:     Effort: Pulmonary effort is normal.     Breath sounds: Normal breath sounds.  Musculoskeletal:     Right lower leg: No edema.     Left lower leg: No edema.  Neurological:     General: No focal deficit present.     Mental Status: She is alert and oriented to person, place, and time.  Psychiatric:        Mood and Affect: Mood normal.        Behavior: Behavior normal.        Thought Content: Thought content normal.        Judgment: Judgment normal.      No results found for any visits on 11/21/23.

## 2023-12-10 ENCOUNTER — Other Ambulatory Visit: Payer: Self-pay

## 2023-12-10 ENCOUNTER — Ambulatory Visit (INDEPENDENT_AMBULATORY_CARE_PROVIDER_SITE_OTHER): Payer: Self-pay | Admitting: Family Medicine

## 2023-12-10 VITALS — BP 132/86 | Ht 67.0 in | Wt 152.0 lb

## 2023-12-10 DIAGNOSIS — M25552 Pain in left hip: Secondary | ICD-10-CM

## 2023-12-10 DIAGNOSIS — G8929 Other chronic pain: Secondary | ICD-10-CM

## 2023-12-10 MED ORDER — NITROGLYCERIN 0.2 MG/HR TD PT24: MEDICATED_PATCH | TRANSDERMAL | 1 refills | Status: AC

## 2023-12-10 NOTE — Progress Notes (Unsigned)
 PCP: Aletha Bene, MD  Subjective:   HPI: Patient is a 63 y.o. female here for left-sided hamstring pain.  Jacqueline Shaw states she has had bilateral medial quadriceps pain for several years, however at the end of last year she began experiencing focal pain at her left ischial tuberosity. The pain she describes as an ache similar to that of a bruise, with it sometimes radiating medially into her groin. This pain was worsened by exercise, particularly running and cycling. She states it eventually became painful for her to sit, and as a result she took several months off of exercise. She had improvement in her pain as a result, however when she resumed exercise her pain returned. She has been taking ibuprofen and tylenol  for her pain with some benefit. She has not had any formal PT for this concern. She is not having any numbness, tingling, radicular pain down her legs, unintentional weight loss.   Past Medical History:  Diagnosis Date   Asthma    Chronic left hip pain    CLOSED FRACTURE OF METATARSAL BONE    Concussion syndrome    Depression    Muscular fatigue    Stroke [facility])     Current Outpatient Medications on File Prior to Visit  Medication Sig Dispense Refill   amphetamine -dextroamphetamine  (ADDERALL) 20 MG tablet Take 1 tablet (20 mg total) by mouth daily. 90 tablet 0   Cholecalciferol (VITAMIN D3) 2400 UNIT/ML LIQD Take by mouth.     EPINEPHrine 0.3 mg/0.3 mL IJ SOAJ injection INJECT 0.3 ML (0.3MG ) INTO THE MUSCLE  0   estradiol (VIVELLE-DOT) 0.025 MG/24HR Place 1 patch onto the skin 2 (two) times a week.     gabapentin (NEURONTIN) 100 MG capsule TAKE ONE CAPSULE BY MOUTH AT NIGHT TIME AND INCREASE TO 3 CAPSULES FOR INSOMNIA/HOT FLASHES  3   HYDROcodone -acetaminophen  (NORCO/VICODIN) 5-325 MG tablet Take 1 tablet by mouth every 8 (eight) hours as needed. 15 tablet 0   levocetirizine (XYZAL ) 5 MG tablet Take 1 tablet (5 mg total) by mouth every evening. 30 tablet 2   Multiple Vitamin  (MULTIVITAMIN) tablet Take 1 tablet by mouth daily.       PROAIR  HFA 108 (90 Base) MCG/ACT inhaler Inhale 2 puffs into the lungs every 4 (four) hours as needed for wheezing or shortness of breath. 1 each 2   progesterone (PROMETRIUM) 100 MG capsule Take 1 capsule by mouth at bedtime.     VYVANSE 30 MG capsule      No current facility-administered medications on file prior to visit.    Past Surgical History:  Procedure Laterality Date   CLAVICLE SURGERY Bilateral     Allergies  Allergen Reactions   Bee Venom Anaphylaxis and Other (See Comments)    Passed out Passed out   [employer] Other (See Comments)    Passed out   Shellfish Allergy Anaphylaxis    BP 132/86   Ht 5' 7 (1.702 m)   Wt 152 lb (68.9 kg)   LMP 08/30/2013   BMI 23.81 kg/m       No data to display              No data to display              Objective:  Physical Exam:  Gen: NAD, comfortable in exam room  Left Hip Inspection: No bruising, swelling Palpation: Tender to ischial tuberosity. No tenderness along IT band, gluteal muscles, greater trochanter.  ROM: Flexion 120 degrees, Abduction normal  Strength: Flexion 5/5, Extension 5/5, Abduction 5/5,  Special Tests: Log roll negative, FABER negative, Ober negative, Resisted extension painful  Left Knee Inspection: No bruising, swelling Strength: Flexion 5/5, Extension 5/5 Special Tests: Resisted flexion painful  [facility] Limited hip ultrasound performed, L: Date:12/10/2023 Indication: Chronic ischial pain Findings: - Ischial tuberosity was visualized in short and long axis. The is irregular bone contours along the insertion of the hamstring tendons. No abrupt step-offs noted.  - There are no hypoechoic effusions noted along the ischial tuberosity nor quadriceps tendons.  - The hamstring tendons were viewed in short and long axis. There is continuity of the tendon fibers with intermittent calcifications and areas of hyperechogenicity.     IMPRESSION:  - Changes to ischium and hamstring tendons suggestive of hamstring tendinopathy   - No evidence of an acute hamstring tear  U/S images and interpretation completed by Ludie Littler, MD Images saved to PACS    Assessment & Plan:  Patient is a 63 y.o. female here for acute on chronic right left hamstring pain. The chronicity of her pain, worsening with hip extension and knee flexion against resistance, and US  findings of all indicate that her pain is due to hamstring tendinopathy. There was no evidence of an acute tear on imaging and given her preserved strength we have low suspicion of this. Plan for now is to treat with nitroglycerin patches, formal PT for the hamstrings, and activity modification.   1. Left Hamstring Tendinopathy  - Nitroglycerin 1/4 patch applied to affected area - PT referral placed to [facility] - Avoid exercises that worsen symptoms, especially sprints, deep lunges, downhill running - Will follow-up in 6 weeks, can consider shockwave therapy versus steroid injection should pain persist   [name], MS4 [facility]

## 2023-12-10 NOTE — Patient Instructions (Signed)
 You have a proximal hamstring strain/tendinopathy. Start nitroglycerin patches 1/4th patch to affected area, change daily into a slightly different spot. Avoid sprints, deep lunges, deep squats, downhill running, anything that causes a level of pain at a 3 or above on a scale of 1-10. Start physical therapy once every other week at [facility]. Do home exercises on days you don't go to therapy. Follow up with me in 6 weeks for reevaluation.

## 2023-12-11 ENCOUNTER — Encounter: Payer: Self-pay | Admitting: Family Medicine

## 2024-01-02 ENCOUNTER — Ambulatory Visit: Admitting: Family Medicine

## 2024-01-21 ENCOUNTER — Ambulatory Visit: Payer: Self-pay | Admitting: Family Medicine

## 2024-02-26 ENCOUNTER — Encounter: Payer: Self-pay | Admitting: Family Medicine

## 2024-02-26 ENCOUNTER — Other Ambulatory Visit: Payer: Self-pay

## 2024-02-26 DIAGNOSIS — F900 Attention-deficit hyperactivity disorder, predominantly inattentive type: Secondary | ICD-10-CM

## 2024-02-26 MED ORDER — AMPHETAMINE-DEXTROAMPHETAMINE 20 MG PO TABS: 20.0000 mg | ORAL_TABLET | Freq: Every day | ORAL | 0 refills | Status: DC

## 2024-03-24 ENCOUNTER — Ambulatory Visit (INDEPENDENT_AMBULATORY_CARE_PROVIDER_SITE_OTHER): Payer: Self-pay | Admitting: Family Medicine

## 2024-03-24 ENCOUNTER — Encounter: Payer: Self-pay | Admitting: Family Medicine

## 2024-03-24 VITALS — BP 120/86 | HR 105 | Temp 98.2°F | Ht 67.0 in | Wt 152.0 lb

## 2024-03-24 DIAGNOSIS — J301 Allergic rhinitis due to pollen: Secondary | ICD-10-CM

## 2024-03-24 DIAGNOSIS — R6889 Other general symptoms and signs: Secondary | ICD-10-CM

## 2024-03-24 DIAGNOSIS — F900 Attention-deficit hyperactivity disorder, predominantly inattentive type: Secondary | ICD-10-CM

## 2024-03-24 DIAGNOSIS — R5383 Other fatigue: Secondary | ICD-10-CM

## 2024-03-24 MED ORDER — MONTELUKAST SODIUM 10 MG PO TABS: 10.0000 mg | ORAL_TABLET | Freq: Every day | ORAL | 3 refills | Status: AC

## 2024-03-24 NOTE — Progress Notes (Unsigned)
 Patient Office Visit  Assessment & Plan:  Attention deficit hyperactivity disorder, inattentive type  Allergic rhinitis due to pollen, unspecified seasonality -     Montelukast Sodium; Take 1 tablet (10 mg total) by mouth at bedtime.  Dispense: 90 tablet; Refill: 3  Other fatigue -     Comprehensive metabolic panel with GFR  Exercise intolerance   Assessment and Plan    Asthma with chronic cough Chronic cough possibly linked to silent reflux. Albuterol  used infrequently, mainly pre-exercise. Hesitant about inhaled corticosteroids due to systemic concerns. - Use albuterol  during cough episodes to assess asthma-related etiology. - Consider inhaled corticosteroids if albuterol  effective. - Discuss silent reflux and consider omeprazole or Protonix.  Allergic rhinitis Significant symptoms managed with Xyzal , partial relief. Inquiry about increasing Xyzal  dose. - Increase Xyzal  to twice daily if needed, monitor for dry mouth. - Prescribe Singulair at night for asthma and allergy relief.  Pelvic organ prolapse Worsening symptoms with heavy lifting and coughing. Concern about chronic cough impact and surgical options due to recurrence risk.  Constipation Chronic constipation.  Attention-deficit hyperactivity disorder (ADHD) Significant improvement with Adderall. Pharmacy dispensing issue noted. - Ensure Adderall prescription filled as 90-day supply.  General Health Maintenance Hesitant about colon cancer screening due to psychological concerns. Discussed importance and benefits of early detection. - Discuss importance of colon cancer screening and benefits of early detection.     Patient will send message via mychart if coughing episodes do not improve. Consider adding inhaled corticosteroid to her regimen either symbicort or airsupra. Discussed recommended screenings with patient ie colon cancer screening/cologuard and mammogram. We added Singulair to her regimen to see if this  will help with allergy/asthma symptoms.  RTC 3-4 mos or sooner if nec.  Return in about 3 months (around 06/24/2024), or if symptoms worsen or fail to improve.   Subjective:    Patient ID: Jacqueline Shaw, female    DOB: 09-01-1960  Age: 63 y.o. MRN: [id number]  Chief Complaint  Patient presents with   Medical Management of Chronic Issues    HPI Discussed the use of AI scribe software for clinical note transcription with the patient, who gave verbal consent to proceed.  History of Present Illness        History of Present Illness Jacqueline Shaw is a 63 year old female who presents with fatigue and exercise intolerance.  She experiences significant fatigue and exercise intolerance, with a noticeable decline in her ability to perform physical activities. She can no longer maintain the same level of activity as in previous years and often needs to rest for a couple of hours after workouts. She is frustrated by her inability to keep up with peers during bike trips and other physical activities.  Her workouts include running a 5K at a 10:25 mile pace and participating in high-intensity interval training (HIIT) classes three times a week. Despite her efforts, her performance is hindered by her body's inability to handle heat, leading to dizziness and reduced power output during exercise. She has tried various hydration strategies, including consuming high sodium electrolytes, but remains unsure if she is adequately hydrated. She experiences significant sweating during workouts, which impacts her performance, and struggles to cool down, especially in temperatures above 80 degrees, limiting her ability to ride her bike. She has not weighed herself before and after workouts to measure sweat loss, as she finds it mentally taxing.  She has a persistent cough and allergies, for which she takes Xyzal  over the counter and uses  Albuterol  as needed. Her cough is exacerbated by dryness in her throat, leading to  socially embarrassing situations such as urinary incontinence when she coughs. She has not tried maintenance inhaler for asthma such as Symbicort or Advair  She has a history of hip pain, particularly on the left side, which has been a persistent issue for her. She has seen Sports Medicine specialist in the past and continues to experience discomfort. She has not returned to a previous specialist, Dr. Harvey, but expresses a preference for his expertise.  She is currently using Adderall, which significantly improves her physical and mental state, reducing pain and enhancing her ability to function. She describes feeling 'out of it' and in pain upon waking, but notes a marked improvement after taking Adderall and caffeine.  She has a history of uterine prolapse and hernia, which she attributes to violent coughing episodes. She is concerned about the impact of her cough on potential surgical outcomes for prolapse repair. She also experiences constipation, which she manages actively to prevent straining.  She has not undergone recent colon cancer screening, expressing reluctance due to past negative experiences with medical testing and a preference to manage her health through fitness. She acknowledges the importance of early detection but is hesitant to pursue further testing. She has not had a mammogram and does not want to pursue this either.  Assessment and Plan Asthma with chronic cough Chronic cough possibly linked to silent reflux. Albuterol  used infrequently, mainly pre-exercise. Hesitant about inhaled corticosteroids due to systemic concerns. - Use albuterol  during cough episodes to assess asthma-related etiology. - Consider inhaled corticosteroids if albuterol  effective. - Discuss silent reflux and consider omeprazole or Protonix.  Allergic rhinitis Significant symptoms managed with Xyzal , partial relief. Inquiry about increasing Xyzal  dose. - Increase Xyzal  to twice daily if needed,  monitor for dry mouth. - Prescribe Singulair at night for asthma and allergy relief.  Pelvic organ prolapse Worsening symptoms with heavy lifting and coughing. Concern about chronic cough impact and surgical options due to recurrence risk.  Constipation Chronic constipation.  Attention-deficit hyperactivity disorder (ADHD) Significant improvement with Adderall. Pharmacy dispensing issue noted. - Ensure Adderall prescription filled as 90-day supply.  General Health Maintenance Hesitant about colon cancer screening due to psychological concerns. Discussed importance and benefits of early detection. - Discuss importance of colon cancer screening and benefits of early detection.    The ASCVD Risk score (Arnett DK, et al., 2019) failed to calculate for the following reasons:   Risk score cannot be calculated because patient has a medical history suggesting prior/existing ASCVD  Past Medical History:  Diagnosis Date   Asthma    Chronic left hip pain    CLOSED FRACTURE OF METATARSAL BONE    Concussion syndrome    Depression    Muscular fatigue    Stroke [facility])    Past Surgical History:  Procedure Laterality Date   CLAVICLE SURGERY Bilateral    Social History   Tobacco Use   Smoking status: Former   Smokeless tobacco: Never  Substance Use Topics   Alcohol use: Not Currently   Drug use: No   Family History  Problem Relation Age of Onset   Glaucoma Mother    Macular degeneration Mother    Hypertension Mother    Rheum arthritis Mother    Lymphoma Father    Melanoma Father    Alcohol abuse Son    Anxiety disorder Son    Allergies  Allergen Reactions   Bee Venom Anaphylaxis and Other (  See Comments)    Passed out Passed out   [employer] Other (See Comments)    Passed out   Shellfish Allergy Anaphylaxis   Shellfish-Derived Products Anaphylaxis, Nausea Only and Shortness Of Breath    ROS    Objective:    BP 120/86   Pulse (!) 105   Temp 98.2 F (36.8  C)   Ht 5' 7 (1.702 m)   Wt 152 lb (68.9 kg)   LMP 08/30/2013   SpO2 96%   BMI 23.81 kg/m  BP Readings from Last 3 Encounters:  03/24/24 120/86  12/10/23 132/86  11/21/23 124/82   Wt Readings from Last 3 Encounters:  03/24/24 152 lb (68.9 kg)  12/10/23 152 lb (68.9 kg)  11/21/23 155 lb (70.3 kg)    Physical Exam Vitals and nursing note reviewed.  Constitutional:      General: She is not in acute distress.    Appearance: Normal appearance.     Comments: Patient does cough during the lung exam  HENT:     Head: Normocephalic.     Right Ear: Tympanic membrane, ear canal and external ear normal.     Left Ear: Tympanic membrane, ear canal and external ear normal.  Eyes:     Extraocular Movements: Extraocular movements intact.     Conjunctiva/sclera: Conjunctivae normal.     Pupils: Pupils are equal, round, and reactive to light.  Cardiovascular:     Rate and Rhythm: Normal rate and regular rhythm.     Heart sounds: Normal heart sounds.  Pulmonary:     Effort: Pulmonary effort is normal.     Breath sounds: Normal breath sounds. No wheezing or rhonchi.  Musculoskeletal:     Right lower leg: No edema.     Left lower leg: No edema.  Neurological:     General: No focal deficit present.     Mental Status: She is alert and oriented to person, place, and time.  Psychiatric:        Mood and Affect: Mood normal.        Behavior: Behavior normal.        Thought Content: Thought content normal.        Judgment: Judgment normal.      Results for orders placed or performed in visit on 03/24/24  Comprehensive metabolic panel with GFR  Result Value Ref Range   Glucose, Bld 92 65 - 99 mg/dL   BUN 21 7 - 25 mg/dL   Creat 9.15 9.49 - 8.94 mg/dL   eGFR 79 > OR = 60 fO/fpw/8.26f7   BUN/Creatinine Ratio SEE NOTE: 6 - 22 (calc)   Sodium 137 135 - 146 mmol/L   Potassium 4.7 3.5 - 5.3 mmol/L   Chloride 101 98 - 110 mmol/L   CO2 29 20 - 32 mmol/L   Calcium 10.6 (H) 8.6 - 10.4 mg/dL    Total Protein 7.6 6.1 - 8.1 g/dL   Albumin 4.7 3.6 - 5.1 g/dL   Globulin 2.9 1.9 - 3.7 g/dL (calc)   AG Ratio 1.6 1.0 - 2.5 (calc)   Total Bilirubin 0.5 0.2 - 1.2 mg/dL   Alkaline phosphatase (APISO) 90 37 - 153 U/L   AST 27 10 - 35 U/L   ALT 26 6 - 29 U/L

## 2024-03-25 ENCOUNTER — Ambulatory Visit: Payer: Self-pay | Admitting: Family Medicine

## 2024-03-25 LAB — COMPREHENSIVE METABOLIC PANEL WITH GFR
AG Ratio: 1.6 (calc) (ref 1.0–2.5)
ALT: 26 U/L (ref 6–29)
AST: 27 U/L (ref 10–35)
Albumin: 4.7 g/dL (ref 3.6–5.1)
Alkaline phosphatase (APISO): 90 U/L (ref 37–153)
BUN: 21 mg/dL (ref 7–25)
CO2: 29 mmol/L (ref 20–32)
Calcium: 10.6 mg/dL — ABNORMAL HIGH (ref 8.6–10.4)
Chloride: 101 mmol/L (ref 98–110)
Creat: 0.84 mg/dL (ref 0.50–1.05)
Globulin: 2.9 g/dL (ref 1.9–3.7)
Glucose, Bld: 92 mg/dL (ref 65–99)
Potassium: 4.7 mmol/L (ref 3.5–5.3)
Sodium: 137 mmol/L (ref 135–146)
Total Bilirubin: 0.5 mg/dL (ref 0.2–1.2)
Total Protein: 7.6 g/dL (ref 6.1–8.1)
eGFR: 79 mL/min/1.73m2 (ref >=60)

## 2024-05-01 ENCOUNTER — Other Ambulatory Visit: Payer: Self-pay

## 2024-05-01 ENCOUNTER — Ambulatory Visit (INDEPENDENT_AMBULATORY_CARE_PROVIDER_SITE_OTHER): Payer: Self-pay | Admitting: Family Medicine

## 2024-05-01 ENCOUNTER — Encounter: Payer: Self-pay | Admitting: Family Medicine

## 2024-05-01 VITALS — BP 128/70 | Ht 67.0 in | Wt 148.0 lb

## 2024-05-01 DIAGNOSIS — R202 Paresthesia of skin: Secondary | ICD-10-CM

## 2024-05-01 DIAGNOSIS — M79671 Pain in right foot: Secondary | ICD-10-CM

## 2024-05-01 DIAGNOSIS — M7660 Achilles tendinitis, unspecified leg: Secondary | ICD-10-CM

## 2024-05-01 DIAGNOSIS — M7751 Other enthesopathy of right foot: Secondary | ICD-10-CM

## 2024-05-01 NOTE — Progress Notes (Unsigned)
 PCP: Aletha Bene, MD  Patient is a 63 y.o. female here for right heel pain.  HPI Patient reports she started having severe right heel pain after running two 5K races last month. Prior to that she had not done much running recently, though she has a fairly active history. She shares that she has had to stop/cut back on cycling and HIIT in the last year due to other injuries. Heel pain is burning at times and difficult to bear weight. She feels like her achilles tendon is tight. She also reports history of neuropathy to bilateral feet from midfoot through toes, now with more recent onset of shocks that come and go suddenly throughout her body. Her chart reflects possible diagnosis of MS, but she is unsure about this and thinks it may be an error. She recalls a Neurologist specifically telling her that she did not have MS.  Past Medical History:  Diagnosis Date   Asthma    Chronic left hip pain    CLOSED FRACTURE OF METATARSAL BONE    Concussion syndrome    Depression    Muscular fatigue    Stroke [facility])     Current Outpatient Medications on File Prior to Visit  Medication Sig Dispense Refill   amphetamine -dextroamphetamine  (ADDERALL) 20 MG tablet Take 1 tablet (20 mg total) by mouth daily. 90 tablet 0   Cholecalciferol (VITAMIN D3) 2400 UNIT/ML LIQD Take by mouth.     EPINEPHrine 0.3 mg/0.3 mL IJ SOAJ injection INJECT 0.3 ML (0.3MG ) INTO THE MUSCLE  0   estradiol (VIVELLE-DOT) 0.025 MG/24HR Place 1 patch onto the skin 2 (two) times a week.     gabapentin (NEURONTIN) 100 MG capsule TAKE ONE CAPSULE BY MOUTH AT NIGHT TIME AND INCREASE TO 3 CAPSULES FOR INSOMNIA/HOT FLASHES  3   HYDROcodone -acetaminophen  (NORCO/VICODIN) 5-325 MG tablet Take 1 tablet by mouth every 8 (eight) hours as needed. 15 tablet 0   levocetirizine (XYZAL ) 5 MG tablet Take 1 tablet (5 mg total) by mouth every evening. 30 tablet 2   montelukast (SINGULAIR) 10 MG tablet Take 1 tablet (10 mg total) by mouth at  bedtime. 90 tablet 3   Multiple Vitamin (MULTIVITAMIN) tablet Take 1 tablet by mouth daily.       nitroGLYCERIN  (NITRODUR - DOSED IN MG/24 HR) 0.2 mg/hr patch Apply 1/4th patch to affected hamstring, change daily 30 patch 1   PROAIR  HFA 108 (90 Base) MCG/ACT inhaler Inhale 2 puffs into the lungs every 4 (four) hours as needed for wheezing or shortness of breath. 1 each 2   progesterone (PROMETRIUM) 100 MG capsule Take 1 capsule by mouth at bedtime.     No current facility-administered medications on file prior to visit.    Past Surgical History:  Procedure Laterality Date   CLAVICLE SURGERY Bilateral     Allergies  Allergen Reactions   Bee Venom Anaphylaxis and Other (See Comments)    Passed out Passed out   [employer] Other (See Comments)    Passed out   Shellfish Allergy Anaphylaxis   Shellfish Protein-Containing [employer] Products Anaphylaxis, Nausea Only and Shortness Of Breath    BP 128/70   Ht 5' 7 (1.702 m)   Wt 148 lb (67.1 kg)   LMP 08/30/2013   BMI 23.18 kg/m       No data to display              No data to display  Objective:  Physical Exam: Gen: NAD, comfortable in exam room  Right Foot Exam No gross deformity or ecchymosis, though there is some mild edema visible on medial aspect of ankle. Significant pain on palpation of tissue just posterior to medial malleolus. Full ROM and strength, though right ankle is not as flexible compared to left. Negative calcaneal squeeze. Neurovascularly intact distally.  Assessment and Plan:  Right heel pain - achilles tendinopathy and bursitis. Hypervascularity/inflammation visualized on ultrasound in the office today. Low suspicion for calcaneal stress fracture contributing. Advised topical Voltaren gel, heel cups, and home exercises as provided today. Strongly encouraged her to avoid running, excessive walking, exercises involving significant weight-bearing or strain to the heels. She will follow  up in 6-8 weeks. ?History of MS - I strongly encourage her to return to Neurologist to follow up her new shock sensations and clarify her diagnosis.

## 2024-05-01 NOTE — Patient Instructions (Signed)
 You have Achilles Tendinopathy and retrocalcaneal bursitis that is causing your pain You can use over-the-counter Voltaren gel which you can apply every 6-8 hours to help with pain and inflammation.  This is essentially an anti-inflammatory cream that will not irritate your stomach or your kidneys. Do home exercises daily as directed. Icing 15 minutes at a time 3-4 times a day as needed Okay for stationary bike and cycling, you should limit your walking.  If you are walking should avoid uneven ground and walk short distances on flat surfaces Heel cups in shoes and wear shoes with a natural heel lift to help unload the area Avoid flat shoes, barefoot walking. Follow up in 6-8 weeks.  As discussed, review of your chart shows that you may have potential multiple sclerosis/MS I recommend that you follow-up with your previous neurologist or your PCP to discuss further It is possible if you do have something like this this may be contributing to your symptoms

## 2024-06-26 ENCOUNTER — Ambulatory Visit (INDEPENDENT_AMBULATORY_CARE_PROVIDER_SITE_OTHER): Payer: Self-pay | Admitting: Family Medicine

## 2024-06-26 ENCOUNTER — Encounter: Payer: Self-pay | Admitting: Family Medicine

## 2024-06-26 VITALS — BP 128/80 | Ht 67.0 in | Wt 148.0 lb

## 2024-06-26 DIAGNOSIS — M7660 Achilles tendinitis, unspecified leg: Secondary | ICD-10-CM

## 2024-06-26 DIAGNOSIS — M7751 Other enthesopathy of right foot: Secondary | ICD-10-CM

## 2024-06-26 NOTE — Progress Notes (Signed)
 "  PCP: Aletha Bene, MD  Patient is a 64 y.o. female here for right Achilles pain.  HPI Right Achilles Patient here for follow-up after being seen on 11/13 for right heel pain.  At this time ultrasound was in office with hypervascularity/inflammation which was visualized.  There is minimal concern for calcaneal stress fracture with history of running to 5K races in the month prior.  Patient was advised to avoid strenuous activity involving significant weightbearing for couple weeks, to reduce strain across the Achilles.  Today she describes her pain is much improved.  She has been resting as recommended.  However, she did a 10-mile walk on 12/24, which aggravated it, but since then she has rested and it has relieved some of the pain.  She is using plantar fasciitis socks and heel cups that have helped as well.  Sparingly uses Advil as needed for pain, otherwise moderates the pain with decreasing activity.  She has noticed that the shoes she was wearing while walking and when symptoms flare prior to last visit have a stiff/rigid heel cup compared to some of her other shoes.   Past Medical History:  Diagnosis Date   Asthma    Chronic left hip pain    CLOSED FRACTURE OF METATARSAL BONE    Concussion syndrome    Depression    Muscular fatigue    Stroke (HCC)     Medications Ordered Prior to Encounter[1]  Past Surgical History:  Procedure Laterality Date   CLAVICLE SURGERY Bilateral     Allergies[2]  BP 128/80   Ht 5' 7 (1.702 m)   Wt 148 lb (67.1 kg)   LMP 08/30/2013   BMI 23.18 kg/m       No data to display              No data to display              Objective:  Physical Exam:  Right ankle/Achilles Gen: NAD, comfortable in exam room Inspection: No bruising or swelling noted Palpation: No TTP over ankle or Achilles tendon. ROM: FROM.  No pain with plantarflexion or dorsiflexion. 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 - Recommended moderating activity to limit aggravation, recommended less mileage with more frequency rather than higher mileage walks or runs occasionally - Advised strength training, OTC Voltaren gel, NSAIDs, and Tylenol  as needed for pain --Should continue to wear heel cups and supportive footwear.  Did discuss trying to limit shoes with a stiff/rigid heel cup. --Should continue home exercise program provided last visit - Follow-up as needed.  If symptoms worsening could consider topical nitroglycerin , shockwave therapy, or formal physical therapy  Kathrine Melena, DO [facility]     [1]  Current Outpatient Medications on File Prior to Visit  Medication Sig Dispense Refill   amphetamine -dextroamphetamine  (ADDERALL) 20 MG tablet Take 1 tablet (20 mg total) by mouth daily. 90 tablet 0   Cholecalciferol (VITAMIN D3) 2400 UNIT/ML LIQD Take by mouth.     EPINEPHrine 0.3 mg/0.3 mL IJ SOAJ injection INJECT 0.3 ML (0.3MG ) INTO THE MUSCLE  0   estradiol (VIVELLE-DOT) 0.025 MG/24HR Place 1 patch onto the skin 2 (two) times a week.     gabapentin (NEURONTIN) 100 MG capsule TAKE ONE CAPSULE BY MOUTH AT NIGHT TIME AND INCREASE TO 3 CAPSULES  FOR INSOMNIA/HOT FLASHES  3   HYDROcodone -acetaminophen  (NORCO/VICODIN) 5-325 MG tablet Take 1 tablet by mouth every 8 (eight) hours as needed. 15 tablet 0   levocetirizine (XYZAL ) 5 MG tablet Take 1 tablet (5 mg total) by mouth every evening. 30 tablet 2   montelukast  (SINGULAIR ) 10 MG tablet Take 1 tablet (10 mg total) by mouth at bedtime. 90 tablet 3   Multiple Vitamin (MULTIVITAMIN) tablet Take 1 tablet by mouth daily.       nitroGLYCERIN  (NITRODUR - DOSED IN MG/24 HR) 0.2 mg/hr patch Apply 1/4th patch to affected hamstring, change daily 30 patch 1   PROAIR  HFA  108 (90 Base) MCG/ACT inhaler Inhale 2 puffs into the lungs every 4 (four) hours as needed for wheezing or shortness of breath. 1 each 2   progesterone (PROMETRIUM) 100 MG capsule Take 1 capsule by mouth at bedtime.     No current facility-administered medications on file prior to visit.  [2]  Allergies Allergen Reactions   Bee Venom Anaphylaxis and Other (See Comments)    Passed out Passed out   [employer] Other (See Comments)    Passed out   Shellfish Allergy Anaphylaxis   Shellfish Protein-Containing Drug Products Anaphylaxis, Nausea Only and Shortness Of Breath   "

## 2024-07-10 ENCOUNTER — Encounter: Payer: Self-pay | Admitting: Family Medicine

## 2024-07-10 ENCOUNTER — Other Ambulatory Visit: Payer: Self-pay

## 2024-07-10 DIAGNOSIS — F900 Attention-deficit hyperactivity disorder, predominantly inattentive type: Secondary | ICD-10-CM

## 2024-07-15 MED ORDER — AMPHETAMINE-DEXTROAMPHETAMINE 20 MG PO TABS: 20.0000 mg | ORAL_TABLET | Freq: Every day | ORAL | 0 refills | Status: AC
